# Patient Record
Sex: Female | Born: 1990 | Race: Black or African American | Hispanic: No | Marital: Single | Smoking: Never smoker
Health system: Southern US, Community
[De-identification: ages and names within clinical notes are randomized; demographics above are authoritative.]

## PROBLEM LIST (undated history)

## (undated) DIAGNOSIS — I10 Essential (primary) hypertension: Secondary | ICD-10-CM

## (undated) HISTORY — DX: Essential (primary) hypertension: I10

## (undated) HISTORY — PX: WISDOM TOOTH EXTRACTION: SHX21

---

## 2013-04-12 ENCOUNTER — Encounter (HOSPITAL_COMMUNITY): Payer: Self-pay | Admitting: Emergency Medicine

## 2013-04-12 ENCOUNTER — Emergency Department (HOSPITAL_COMMUNITY)
Admission: EM | Admit: 2013-04-12 | Discharge: 2013-04-13 | Disposition: A | Discharge status: Home / Self Care | Payer: 59 | Attending: Emergency Medicine | Admitting: Emergency Medicine

## 2013-04-12 DIAGNOSIS — W19XXXA Unspecified fall, initial encounter: Secondary | ICD-10-CM

## 2013-04-12 DIAGNOSIS — R000 Tachycardia, unspecified: Secondary | ICD-10-CM | POA: Insufficient documentation

## 2013-04-12 DIAGNOSIS — S7000XA Contusion of unspecified hip, initial encounter: Secondary | ICD-10-CM | POA: Insufficient documentation

## 2013-04-12 DIAGNOSIS — M545 Low back pain, unspecified: Secondary | ICD-10-CM | POA: Insufficient documentation

## 2013-04-12 DIAGNOSIS — W010XXA Fall on same level from slipping, tripping and stumbling without subsequent striking against object, initial encounter: Secondary | ICD-10-CM | POA: Insufficient documentation

## 2013-04-12 DIAGNOSIS — S7002XA Contusion of left hip, initial encounter: Secondary | ICD-10-CM

## 2013-04-12 DIAGNOSIS — Y929 Unspecified place or not applicable: Secondary | ICD-10-CM | POA: Insufficient documentation

## 2013-04-12 DIAGNOSIS — Y9301 Activity, walking, marching and hiking: Secondary | ICD-10-CM | POA: Insufficient documentation

## 2013-04-12 NOTE — ED Notes (Signed)
Pt st's she slipped in oil and fell backwards,  Pt c/o pain in left hip and left lower back.

## 2013-04-12 NOTE — ED Notes (Signed)
Pt. slipped and fell on slippery floor at [employer] this evening , no LOC / Ambulatory , reports pain at left hip and lower back .

## 2013-04-13 MED ORDER — IBUPROFEN 400 MG PO TABS
600.0000 mg | ORAL_TABLET | Freq: Once | ORAL | Status: AC
  Administered 2013-04-13: 600 mg via ORAL
  Filled 2013-04-13 (×2): qty 1

## 2013-04-13 MED ORDER — IBUPROFEN 600 MG PO TABS: 600.0000 mg | ORAL_TABLET | Freq: Once | ORAL | Status: DC

## 2013-04-13 NOTE — ED Provider Notes (Signed)
Medical screening examination/treatment/procedure(s) were performed by non-physician practitioner and as supervising physician I was immediately available for consultation/collaboration.   Halynn Reitano, MD 04/13/13 0650 

## 2013-04-13 NOTE — ED Provider Notes (Signed)
CSN: [id number]     Arrival date & time 04/12/13  2259 History   First MD Initiated Contact with Patient 04/12/13 2358     Chief Complaint  Patient presents with  . Fall   (Consider location/radiation/quality/duration/timing/severity/associated sxs/prior Treatment) HPI Comments: Patient states she slipped on a wet floor.  This evening landing on her left buttock/back presents with left hip pain.  She has not taken any medication.  Prior to arrival.  She is fully and the tori  Patient is a 22 y.o. female presenting with fall.  Fall This is a new problem. The current episode started today. The problem occurs constantly. The problem has been unchanged. Pertinent negatives include no joint swelling, numbness or weakness. Associated symptoms comments: Left hip pain. The symptoms are aggravated by walking. She has tried nothing for the symptoms. The treatment provided no relief.    History reviewed. No pertinent past medical history. History reviewed. No pertinent past surgical history. No family history on file. History  Substance Use Topics  . Smoking status: Never Smoker   . Smokeless tobacco: Not on file  . Alcohol Use: Yes   OB History   Grav Para Term Preterm Abortions TAB SAB Ect Mult Living                 Review of Systems  Musculoskeletal: Positive for back pain. Negative for joint swelling.  Skin: Negative for wound.  Neurological: Negative for weakness and numbness.  All other systems reviewed and are negative.    Allergies  Review of patient's allergies indicates no known allergies.  Home Medications   Current Outpatient Rx  Name  Route  Sig  Dispense  Refill  . ibuprofen (ADVIL,MOTRIN) 600 MG tablet   Oral   Take 1 tablet (600 mg total) by mouth once.   30 tablet   0    BP 133/86  Pulse 106  Temp(Src) 98.4 F (36.9 C) (Oral)  Resp 18  Wt 207 lb 14.4 oz (94.303 kg)  SpO2 98%  LMP 03/29/2013 Physical Exam  Nursing note and vitals  reviewed. Constitutional: She is oriented to person, place, and time. She appears well-developed and well-nourished.  Eyes: Pupils are equal, round, and reactive to light.  Neck: Normal range of motion.  Cardiovascular: Regular rhythm.  Tachycardia present.   Pulmonary/Chest: Effort normal.  Musculoskeletal: Normal range of motion. She exhibits no edema.       Left hip: She exhibits tenderness. She exhibits normal range of motion, normal strength, no bony tenderness, no swelling, no crepitus and no deformity.       Legs: Neurological: She is alert and oriented to person, place, and time.    ED Course  Procedures (including critical care time) Labs Review Labs Reviewed - No data to display Imaging Review No results found.  EKG Interpretation   None       MDM   1. Fall, initial encounter   2. Contusion, hip, left, initial encounter     At this time.  I do not feel x-rays are warranted.  Patient is fully tori.  Has minimal discomfort with ambulation.  She was prescribed ibuprofen    Arman Filter, NP 04/13/13 0025

## 2013-04-20 ENCOUNTER — Emergency Department (HOSPITAL_COMMUNITY): Payer: 59

## 2013-04-20 ENCOUNTER — Encounter (HOSPITAL_COMMUNITY): Payer: Self-pay | Admitting: Emergency Medicine

## 2013-04-20 ENCOUNTER — Emergency Department (HOSPITAL_COMMUNITY)
Admission: EM | Admit: 2013-04-20 | Discharge: 2013-04-20 | Disposition: A | Discharge status: Home / Self Care | Payer: 59 | Attending: Emergency Medicine | Admitting: Emergency Medicine

## 2013-04-20 DIAGNOSIS — R079 Chest pain, unspecified: Secondary | ICD-10-CM

## 2013-04-20 DIAGNOSIS — R12 Heartburn: Secondary | ICD-10-CM | POA: Insufficient documentation

## 2013-04-20 DIAGNOSIS — F411 Generalized anxiety disorder: Secondary | ICD-10-CM | POA: Insufficient documentation

## 2013-04-20 DIAGNOSIS — Z3202 Encounter for pregnancy test, result negative: Secondary | ICD-10-CM | POA: Insufficient documentation

## 2013-04-20 DIAGNOSIS — R072 Precordial pain: Secondary | ICD-10-CM | POA: Insufficient documentation

## 2013-04-20 LAB — URINE MICROSCOPIC-ADD ON

## 2013-04-20 LAB — URINALYSIS, ROUTINE W REFLEX MICROSCOPIC
Bilirubin Urine: NEGATIVE
Glucose, UA: NEGATIVE mg/dL
Ketones, ur: 15 mg/dL — AB
Leukocytes, UA: NEGATIVE
Protein, ur: 30 mg/dL — AB
Urobilinogen, UA: 1 mg/dL (ref 0.0–1.0)

## 2013-04-20 LAB — PREGNANCY, URINE: Preg Test, Ur: NEGATIVE

## 2013-04-20 MED ORDER — ALPRAZOLAM 0.5 MG PO TABS: 0.5000 mg | ORAL_TABLET | Freq: Every evening | ORAL | Status: DC | PRN

## 2013-04-20 MED ORDER — ALPRAZOLAM 0.25 MG PO TABS
1.0000 mg | ORAL_TABLET | Freq: Once | ORAL | Status: AC
  Administered 2013-04-20: 1 mg via ORAL
  Filled 2013-04-20: qty 4

## 2013-04-20 NOTE — ED Notes (Signed)
Patient transported to X-ray 

## 2013-04-20 NOTE — ED Provider Notes (Signed)
CSN: [id number]     Arrival date & time 04/20/13  8295 History   First MD Initiated Contact with Patient 04/20/13 1004     Chief Complaint  Patient presents with  . Chest Pain   (Consider location/radiation/quality/duration/timing/severity/associated sxs/prior Treatment) Patient is a 22 y.o. female presenting with chest pain. The history is provided by the patient. No language interpreter was used.  Chest Pain Pain location:  Substernal area Pain quality: dull   Pain radiates to:  Does not radiate Pain radiates to the back: no   Pain severity:  Mild Onset quality:  Gradual Timing:  Constant Progression:  Unchanged Chronicity:  New Context: breathing   Relieved by:  None tried Associated symptoms: anxiety   Associated symptoms: no abdominal pain, no back pain, no cough, no dizziness, no fatigue, no fever, no heartburn, no nausea, no near-syncope, no palpitations, no shortness of breath, not vomiting and no weakness   Risk factors: no high cholesterol, no hypertension, no prior DVT/PE and no smoking   pt is a well-appearing, 22 year old female who presents with a history of chest pain since last night at about 8pm. She reports that she has had an aching pain in her chest that hurts when she takes a deep breath. She reports that this pain is substernal and is reproducible. She denies shortness of breath, palpitations, fever, cough or chills. She doesn't have a history of asthma, PE or DVT however, she does have a history significant for anxiety which she is not currently taking any medication. She has been using the nuva ring for birth control for the last 5 months, before that she was taking birth control pills. She denies recent sick exposure or travel. She reports that she sometimes has heartburn and indigestion but she doesn't regularly take medicine for it.    History reviewed. No pertinent past medical history. History reviewed. No pertinent past surgical history. History reviewed.  No pertinent family history. History  Substance Use Topics  . Smoking status: Never Smoker   . Smokeless tobacco: Not on file  . Alcohol Use: Yes   OB History   Grav Para Term Preterm Abortions TAB SAB Ect Mult Living                 Review of Systems  Constitutional: Negative for fever and fatigue.  Respiratory: Negative for cough and shortness of breath.   Cardiovascular: Positive for chest pain. Negative for palpitations and near-syncope.  Gastrointestinal: Negative for heartburn, nausea, vomiting and abdominal pain.  Musculoskeletal: Negative for back pain.  Neurological: Negative for dizziness and weakness.    Allergies  Review of patient's allergies indicates no known allergies.  Home Medications   Current Outpatient Rx  Name  Route  Sig  Dispense  Refill  . ibuprofen (ADVIL,MOTRIN) 600 MG tablet   Oral   Take 1 tablet (600 mg total) by mouth once.   30 tablet   0    BP 112/75  Pulse 89  Temp(Src) 98.6 F (37 C) (Oral)  Resp 20  Ht 5\' 7"  (1.702 m)  Wt 200 lb (90.719 kg)  BMI 31.32 kg/m2  SpO2 98%  LMP 04/06/2013 Physical Exam  Nursing note and vitals reviewed. Constitutional: She is oriented to person, place, and time. She appears well-developed and well-nourished. No distress.  HENT:  Head: Normocephalic and atraumatic.  Mouth/Throat: Oropharynx is clear and moist.  Eyes: Conjunctivae and EOM are normal. Pupils are equal, round, and reactive to light.  Neck: Normal  range of motion. Neck supple. No tracheal deviation present. No thyromegaly present.  Cardiovascular: Normal rate, regular rhythm, normal heart sounds and intact distal pulses.   Pulmonary/Chest: Effort normal and breath sounds normal. No respiratory distress. She has no wheezes.  Abdominal: Soft. Bowel sounds are normal. There is no tenderness.  Musculoskeletal: Normal range of motion.  Lymphadenopathy:    She has no cervical adenopathy.  Neurological: She is alert and oriented to person,  place, and time.  Skin: Skin is warm and dry.  Psychiatric: She has a normal mood and affect. Her behavior is normal. Judgment and thought content normal.    ED Course  Procedures (including critical care time) Labs Review Labs Reviewed - No data to display Imaging Review No results found.  EKG Interpretation     Ventricular Rate:  84 PR Interval:  130 QRS Duration: 86 QT Interval:  368 QTC Calculation: 434 R Axis:   22 Text Interpretation:  Normal sinus rhythm Minimal voltage criteria for LVH, may be normal variant Non-specific t wave changes            MDM   1. Chest pain with low risk for cardiac etiology    Well-appearing 22 year old female. No significant risk factors. Troponin negative. EKG, chest x-ray within normal limits. No recent illness, shortness of breath, difficulty breathing, cough or fever. No history of PE or DVT, non-smoker. Wells criteria=0, Perc=0. Minimal risk for cardiac etiology or PE. Does admit to having a history of anxiety and feeling better after one time dose of alprazolam.       Irish Elders, NP 04/20/13 1216

## 2013-04-20 NOTE — ED Notes (Signed)
Pt reports having mid chest pains that started last night, radiates around to right side, having sob. ekg done at triage, no acute distress noted.

## 2013-04-21 NOTE — ED Provider Notes (Signed)
Medical screening examination/treatment/procedure(s) were performed by non-physician practitioner and as supervising physician I was immediately available for consultation/collaboration.  EKG Interpretation     Ventricular Rate:  84 PR Interval:  130 QRS Duration: 86 QT Interval:  368 QTC Calculation: 434 R Axis:   22 Text Interpretation:  Normal sinus rhythm Minimal voltage criteria for LVH, may be normal variant Non-specific t wave changes              Junius Argyle, MD 04/21/13 [phone]

## 2013-07-23 ENCOUNTER — Emergency Department (HOSPITAL_COMMUNITY)
Admission: EM | Admit: 2013-07-23 | Discharge: 2013-07-23 | Disposition: A | Discharge status: Home / Self Care | Payer: Federal, State, Local not specified - PPO | Source: Home / Self Care | Attending: Family Medicine | Admitting: Family Medicine

## 2013-07-23 ENCOUNTER — Encounter (HOSPITAL_COMMUNITY): Payer: Self-pay | Admitting: Emergency Medicine

## 2013-07-23 DIAGNOSIS — S61209A Unspecified open wound of unspecified finger without damage to nail, initial encounter: Secondary | ICD-10-CM

## 2013-07-23 DIAGNOSIS — S61219A Laceration without foreign body of unspecified finger without damage to nail, initial encounter: Secondary | ICD-10-CM

## 2013-07-23 NOTE — ED Provider Notes (Signed)
CSN: [id number]     Arrival date & time 07/23/13  1720 History   None    Chief Complaint  Patient presents with  . Laceration   (Consider location/radiation/quality/duration/timing/severity/associated sxs/prior Treatment) HPI 23 year old F with a laceration of the right little finger and ulnar side. Occurred several hours ago when getting our of her bed. She struck it on the metal bed frame. It bled for several minutes until she applied pressure. She was in pain so she took aspirin. No problems with use of the finger. Last tetanus prior to college years ago.   History reviewed. No pertinent past medical history. History reviewed. No pertinent past surgical history. No family history on file. History  Substance Use Topics  . Smoking status: Never Smoker   . Smokeless tobacco: Not on file  . Alcohol Use: Yes   OB History   Grav Para Term Preterm Abortions TAB SAB Ect Mult Living                 Review of Systems  Allergies  Review of patient's allergies indicates no known allergies.  Home Medications   Current Outpatient Rx  Name  Route  Sig  Dispense  Refill  . ALPRAZolam (XANAX) 0.5 MG tablet   Oral   Take 1 tablet (0.5 mg total) by mouth at bedtime as needed for sleep.   15 tablet   0   . ibuprofen (ADVIL,MOTRIN) 600 MG tablet   Oral   Take 1 tablet (600 mg total) by mouth once.   30 tablet   0    BP 120/81  Pulse 88  Temp(Src) 98.7 F (37.1 C) (Oral)  Resp 16  SpO2 100% Physical Exam Gen: well appearing, pleasant and conversant Skin: 1.5 cm well approximate superficial laceration with hemostasis achieved on the lateral finger  MSK: right 5th digit with normal flexion and extension, sensation intact   ED Course  Procedures (including critical care time) Labs Review Labs Reviewed - No data to display Imaging Review No results found.    MDM   1. Laceration of finger, right    Simple, clean, hemostasis achieved, given location steri-strips placed  to re-inforce approximation    Garnetta BuddyEdward V Jakeim Sedore, MD 07/23/13 [phone]

## 2013-07-23 NOTE — ED Notes (Signed)
States aprox 3 pm today, she accidentally struck her right 5 th finger on hard object, causing laceration w temp. Bleeding controlled. Injury aprox 2-3 cm , superficial

## 2013-07-23 NOTE — Discharge Instructions (Signed)
Keep the finger dry for the next 24 hours. After that you can wash it. The steri-strips will come off on their own in a couple days. You may also keep a bandage on there to keep if from getting bumped. Take tylenol as needed for pain. Come back if the bleeding starts again.   Sincerely,   Dr. Clinton SawyerWilliamson

## 2013-07-25 NOTE — ED Provider Notes (Signed)
Medical screening examination/treatment/procedure(s) were performed by resident physician or non-physician practitioner and as supervising physician I was immediately available for consultation/collaboration.   Ryane Konieczny DOUGLAS MD.   Ysabel Stankovich D Kenden Brandt, MD 07/25/13 1229 

## 2013-11-04 ENCOUNTER — Encounter (HOSPITAL_COMMUNITY): Payer: Self-pay | Admitting: Emergency Medicine

## 2013-11-04 ENCOUNTER — Emergency Department (HOSPITAL_COMMUNITY)
Admission: EM | Admit: 2013-11-04 | Discharge: 2013-11-04 | Disposition: A | Discharge status: Home / Self Care | Payer: Federal, State, Local not specified - PPO | Attending: Emergency Medicine | Admitting: Emergency Medicine

## 2013-11-04 DIAGNOSIS — Z792 Long term (current) use of antibiotics: Secondary | ICD-10-CM | POA: Insufficient documentation

## 2013-11-04 DIAGNOSIS — K089 Disorder of teeth and supporting structures, unspecified: Secondary | ICD-10-CM | POA: Insufficient documentation

## 2013-11-04 DIAGNOSIS — K029 Dental caries, unspecified: Secondary | ICD-10-CM | POA: Insufficient documentation

## 2013-11-04 DIAGNOSIS — K0889 Other specified disorders of teeth and supporting structures: Secondary | ICD-10-CM

## 2013-11-04 DIAGNOSIS — Z791 Long term (current) use of non-steroidal anti-inflammatories (NSAID): Secondary | ICD-10-CM | POA: Insufficient documentation

## 2013-11-04 MED ORDER — NAPROXEN 250 MG PO TABS
500.0000 mg | ORAL_TABLET | Freq: Once | ORAL | Status: AC
  Administered 2013-11-04: 500 mg via ORAL
  Filled 2013-11-04: qty 2

## 2013-11-04 MED ORDER — PENICILLIN V POTASSIUM 500 MG PO TABS: 500.0000 mg | ORAL_TABLET | Freq: Three times a day (TID) | ORAL | Status: DC

## 2013-11-04 MED ORDER — NAPROXEN 500 MG PO TABS: 500.0000 mg | ORAL_TABLET | Freq: Two times a day (BID) | ORAL | Status: DC

## 2013-11-04 MED ORDER — PENICILLIN V POTASSIUM 250 MG PO TABS
500.0000 mg | ORAL_TABLET | Freq: Once | ORAL | Status: AC
  Administered 2013-11-04: 500 mg via ORAL
  Filled 2013-11-04: qty 2

## 2013-11-04 MED ORDER — OXYCODONE-ACETAMINOPHEN 5-325 MG PO TABS: 2.0000 | ORAL_TABLET | ORAL | Status: DC | PRN

## 2013-11-04 NOTE — ED Notes (Signed)
Pt given Ice pack for pain.

## 2013-11-04 NOTE — Discharge Instructions (Signed)
Dental Pain A tooth ache may be caused by cavities (tooth decay). Cavities expose the nerve of the tooth to air and hot or cold temperatures. It may come from an infection or abscess (also called a boil or furuncle) around your tooth. It is also often caused by dental caries (tooth decay). This causes the pain you are having. DIAGNOSIS  Your caregiver can diagnose this problem by exam. TREATMENT   If caused by an infection, it may be treated with medications which kill germs (antibiotics) and pain medications as prescribed by your caregiver. Take medications as directed.  Only take over-the-counter or prescription medicines for pain, discomfort, or fever as directed by your caregiver.  Whether the tooth ache today is caused by infection or dental disease, you should see your dentist as soon as possible for further care. SEEK MEDICAL CARE IF: The exam and treatment you received today has been provided on an emergency basis only. This is not a substitute for complete medical or dental care. If your problem worsens or new problems (symptoms) appear, and you are unable to meet with your dentist, call or return to this location. SEEK IMMEDIATE MEDICAL CARE IF:   You have a fever.  You develop redness and swelling of your face, jaw, or neck.  You are unable to open your mouth.  You have severe pain uncontrolled by pain medicine. MAKE SURE YOU:   Understand these instructions.  Will watch your condition.  Will get help right away if you are not doing well or get worse. Document Released: 06/10/2005 Document Revised: 09/02/2011 Document Reviewed: 01/27/2008 [facility] Patient Information 2014 [employer] [place].  If you were a [facility] patient or do not have insurance, here are some clinics in our community that may be able to provide care for free or on sliding payment scales.  [facility], [address], [address], [place], [phone]; Monday to  Friday, 9 a.m. - 7 p.m.; Saturday 9 a.m. to 1 p.m.   [facility], [address] [place]; [phone]; or [address],  [place]; [phone].    [facility], [place] [place]; [phone]; Monday to Wednesday, 8:30 a.m. - 5 p.m.; Thursday, 8:30 a.m. - 8 p.m.   [facility], [address], [address], [place]; [phone]; Monday to Friday, 8 a.m. - 4:30 p.m.    [facility], [address] [place], [phone]; first and third Saturday of the  month, 9:30 a.m. - 12:30 p.m.   [facility], [address] [place], [phone]; second Saturday of the month, 9 a.m. -noon.   RESOURCE GUIDE  Dental Problems  Patients with Medicaid: [facility]                                            [facility] [phone]                                                                   [phone] Phone:  [phone]  Phone:  [phone]  If unable to pay or uninsured, contact:  [facility] or [facility] to become qualified for the adult dental clinic.  Chronic Pain Problems Contact [facility]  [phone] Patients need to be referred by their primary care doctor.  Insufficient Money for Medicine Contact United Way:  call "211" or [facility] [phone].  No Primary Care Doctor Call [facility]  [phone] Other agencies that provide inexpensive medical care    [facility]  [phone]    [facility]  [phone]    [facility]  [phone]    [facility]  [phone]    [facility]  [phone]  Psychological Services [facility]  [phone] [facility]  [phone] [facility]   [phone] (emergency services [phone])  Abuse/Neglect [facility] Child [facility] Hotline 401-607-3546(336)  210-332-7599 [facility] Hotline 934 522 [phone] (After Hours)  [facility] [phone]) [phone]  [facility] [place] [facility] [phone] [facility] [phone]  MRSA Hotline #:   (639)110-37832246043357  [facility] Resources  [facility]     United Way                          [facility] [address] [place]                        [address]          [address]  [place]                                                [place]                            [place] Phone:  [phone]                                     Phone:  [phone]                   Phone:  [phone]  [facility] Phone:  [phone]  [facility] Child Abuse Hotline 779-778-4492(336) (361)316-5400 [phone](336) [phone] (After Hours)   Community Resources: *IF YOU ARE IN IMMEDIATE DANGER CALL 911!  Abuse/Neglect:  Family Services Crisis Hotline [facility]): [phone](336) 7737520597 [facility]): [phone](336) [phone]  After hours, holidays and weekends: [phone](336) [phone] National Domestic Violence Hotline: 206 675 [phone]  Mental Health: [facility]: [address]: [phone]  Health Clinics:  [facility]): [phone] Monday - Friday 8 AM - 9 PM, Saturday and Sunday 10 AM - 9 PM   [facility]  [address]: [phone] Monday- Friday 8:30 AM - 5:30 PM, Sat 9 AM - 1 PM  [facility] [address]: [phone] [facility]: [phone]) [phone] [facility] [address]: [phone]  [facility]: [address] [phone]

## 2013-11-04 NOTE — ED Provider Notes (Signed)
CSN: [id number]     Arrival date & time 11/04/13  0131 History   First MD Initiated Contact with Patient 11/04/13 0356     Chief Complaint  Patient presents with  . Dental Pain     (Consider location/radiation/quality/duration/timing/severity/associated sxs/prior Treatment) HPI 23 year old female presents to emergency department with complaint of dental pain.  Patient reports she has had problems with a broken molar and a wisdom tooth that is coming in on the left upper and lower jaws.  She has not contacted a dentist.  Over the last several days, the pain has increased.  She denies any soft tissue swelling.  She's been taking Vicodin and ibuprofen without improvement. History reviewed. No pertinent past medical history. History reviewed. No pertinent past surgical history. No family history on file. History  Substance Use Topics  . Smoking status: Never Smoker   . Smokeless tobacco: Not on file  . Alcohol Use: Yes   OB History   Grav Para Term Preterm Abortions TAB SAB Ect Mult Living                 Review of Systems   See History of Present Illness; otherwise all other systems are reviewed and negative  Allergies  Review of patient's allergies indicates no known allergies.  Home Medications   Prior to Admission medications   Medication Sig Start Date End Date Taking? Authorizing Provider  ALPRAZolam Prudy Feeler(XANAX) 0.5 MG tablet Take 1 tablet (0.5 mg total) by mouth at bedtime as needed for sleep. 04/20/13   Irish EldersKelly Walker, NP  ibuprofen (ADVIL,MOTRIN) 600 MG tablet Take 1 tablet (600 mg total) by mouth once. 04/13/13   Arman FilterGail K Schulz, NP  naproxen (NAPROSYN) 500 MG tablet Take 1 tablet (500 mg total) by mouth 2 (two) times daily. 11/04/13   Olivia Mackielga M Birch Farino, MD  oxyCODONE-acetaminophen (PERCOCET/ROXICET) 5-325 MG per tablet Take 2 tablets by mouth every 4 (four) hours as needed for severe pain. 11/04/13   Olivia Mackielga M Brandee Markin, MD  penicillin v potassium (VEETID) 500 MG tablet Take 1 tablet (500  mg total) by mouth 3 (three) times daily. 11/04/13   Olivia Mackielga M Kadence Mikkelson, MD   BP 118/79  Pulse 94  Temp(Src) 98.2 F (36.8 C) (Oral)  Resp 18  Ht 5\' 7"  (1.702 m)  Wt 201 lb (91.173 kg)  BMI 31.47 kg/m2  SpO2 100% Physical Exam  Nursing note and vitals reviewed. Constitutional: She is oriented to person, place, and time. She appears distressed (uncomfortable appearing).  HENT:  Head: Normocephalic and atraumatic.  Right Ear: External ear normal.  Nose: Nose normal.  Mouth/Throat: Oropharynx is clear and moist. No oropharyngeal exudate.  Patient has dental decay to left most posterior molar on the maxilla  Patient has a wisdom tooth erupting on left mandible.  There is no soft tissue swelling, no signs of acute abscess  Eyes: Conjunctivae and EOM are normal. Pupils are equal, round, and reactive to light. Right eye exhibits no discharge. Left eye exhibits no discharge. No scleral icterus.  Neck: Normal range of motion. Neck supple. No JVD present. No tracheal deviation present. No thyromegaly present.  Cardiovascular: Normal rate, regular rhythm, normal heart sounds and intact distal pulses.  Exam reveals no gallop and no friction rub.   No murmur heard. Pulmonary/Chest: Effort normal and breath sounds normal. No stridor. No respiratory distress. She has no wheezes. She has no rales. She exhibits no tenderness.  Musculoskeletal: Normal range of motion. She exhibits no edema and no tenderness.  Lymphadenopathy:    She has no cervical adenopathy.  Neurological: She is alert and oriented to person, place, and time.  Skin: Skin is warm and dry. No rash noted. No erythema. No pallor.  Psychiatric: She has a normal mood and affect. Her behavior is normal. Judgment and thought content normal.    ED Course  Procedures (including critical care time) Labs Review Labs Reviewed - No data to display  Imaging Review No results found.   EKG Interpretation None      MDM   Final diagnoses:   Pain, dental    23 year old female with dental pain.  Patient advised to follow up with a local dentist.  We'll start her on Percocet and penicillin.    Olivia Mackielga M Yuma Blucher, MD 11/04/13 [phone]

## 2013-11-04 NOTE — ED Notes (Signed)
MD at bedside. 

## 2013-11-04 NOTE — ED Notes (Signed)
Pt states pain in her upper and load jaw on her left side from her wisdom teeth.  Pt states pain started approximately one year prior

## 2014-07-04 ENCOUNTER — Emergency Department (HOSPITAL_COMMUNITY)
Admission: EM | Admit: 2014-07-04 | Discharge: 2014-07-05 | Disposition: A | Discharge status: Home / Self Care | Payer: Federal, State, Local not specified - PPO | Attending: Emergency Medicine | Admitting: Emergency Medicine

## 2014-07-04 ENCOUNTER — Encounter (HOSPITAL_COMMUNITY): Payer: Self-pay | Admitting: Emergency Medicine

## 2014-07-04 DIAGNOSIS — Z793 Long term (current) use of hormonal contraceptives: Secondary | ICD-10-CM | POA: Diagnosis not present

## 2014-07-04 DIAGNOSIS — G43809 Other migraine, not intractable, without status migrainosus: Secondary | ICD-10-CM | POA: Insufficient documentation

## 2014-07-04 DIAGNOSIS — R51 Headache: Secondary | ICD-10-CM | POA: Diagnosis present

## 2014-07-04 NOTE — ED Provider Notes (Signed)
CSN: [id number]     Arrival date & time 07/04/14  2337 History  This chart was scribed for Loren Racer, MD by Annye Asa, ED Scribe. This patient was seen in room D31C/D31C and the patient's care was started at 12:13 AM.    Chief Complaint  Patient presents with  . Migraine   The history is provided by the patient. No language interpreter was used.     HPI Comments: Mikayla Castillo is a 24 y.o. female who presents to the Emergency Department complaining of right-sided migraine headache, described as pressure, beginning today around 12:00. Gradual onset. Similar in character to previous migraines. She reports photophobia, blurred vision, and nausea. She denies vomiting. She attempted to treat her pain with Tylenol at 12:30; no relief provided. She believes she had a sinus infection last week.   LNMP three weeks PTA. She does not associated her headaches with her menstrual cycle or life stress.   History reviewed. No pertinent past medical history. History reviewed. No pertinent past surgical history. No family history on file. History  Substance Use Topics  . Smoking status: Never Smoker   . Smokeless tobacco: Not on file  . Alcohol Use: Yes   OB History    No data available     Review of Systems  Constitutional: Negative for fever and chills.  HENT: Negative for congestion, rhinorrhea and sinus pressure.   Eyes: Positive for photophobia and visual disturbance.  Gastrointestinal: Positive for nausea. Negative for vomiting, abdominal pain and diarrhea.  Neurological: Positive for headaches. Negative for dizziness, weakness, light-headedness and numbness.  All other systems reviewed and are negative.   Allergies  Review of patient's allergies indicates no known allergies.  Home Medications   Prior to Admission medications   Medication Sig Start Date End Date Taking? Authorizing Provider  Norethin Ace-Eth Estrad-FE (MINASTRIN 24 FE PO) Take 1 tablet by mouth daily.    Yes Historical Provider, MD  oxyCODONE-acetaminophen (PERCOCET/ROXICET) 5-325 MG per tablet Take 2 tablets by mouth every 4 (four) hours as needed for severe pain. 11/04/13  Yes Olivia Mackie, MD  ALPRAZolam Prudy Feeler) 0.5 MG tablet Take 1 tablet (0.5 mg total) by mouth at bedtime as needed for sleep. Patient not taking: Reported on 07/05/2014 04/20/13   Irish Elders, NP  ibuprofen (ADVIL,MOTRIN) 600 MG tablet Take 1 tablet (600 mg total) by mouth every 6 (six) hours as needed for headache. 07/05/14   Loren Racer, MD  metoCLOPramide (REGLAN) 10 MG tablet Take 1 tablet (10 mg total) by mouth every 6 (six) hours as needed for nausea (nausea/headache). 07/05/14   Loren Racer, MD  naproxen (NAPROSYN) 500 MG tablet Take 1 tablet (500 mg total) by mouth 2 (two) times daily. Patient not taking: Reported on 07/05/2014 11/04/13   Olivia Mackie, MD  penicillin v potassium (VEETID) 500 MG tablet Take 1 tablet (500 mg total) by mouth 3 (three) times daily. Patient not taking: Reported on 07/05/2014 11/04/13   Olivia Mackie, MD   BP 114/58 mmHg  Pulse 83  Temp(Src) 98.6 F (37 C) (Oral)  Resp 22  Ht  (1.702 m)  Wt 210 lb (95.255 kg)  BMI 32.88 kg/m2  SpO2 100%  LMP 06/20/2014 Physical Exam  Constitutional: She is oriented to person, place, and time. She appears well-developed and well-nourished. No distress.  HENT:  Head: Normocephalic and atraumatic.  Mouth/Throat: Oropharynx is clear and moist.  No sinus tenderness with percussion.  Eyes: EOM are normal. Pupils are equal,  round, and reactive to light.  Neck: Normal range of motion. Neck supple.  No meningismus  Cardiovascular: Normal rate and regular rhythm.  Exam reveals no gallop and no friction rub.   No murmur heard. Pulmonary/Chest: Effort normal and breath sounds normal. No respiratory distress. She has no wheezes. She has no rales. She exhibits no tenderness.  Abdominal: Soft. Bowel sounds are normal. She exhibits no distension and no  mass. There is no tenderness. There is no rebound and no guarding.  Musculoskeletal: Normal range of motion. She exhibits no edema or tenderness.  Neurological: She is alert and oriented to person, place, and time.  Patient is alert and oriented x3 with clear, goal oriented speech. Patient has 5/5 motor in all extremities. Sensation is intact to light touch. Bilateral finger-to-nose is normal with no signs of dysmetria. Patient has a normal gait and walks without assistance.  Skin: Skin is warm and dry. No rash noted. No erythema.  Psychiatric: She has a normal mood and affect. Her behavior is normal.  Nursing note and vitals reviewed.   ED Course  Procedures   .DIAGNOSTIC STUDIES: Oxygen Saturation is 97% on RA, adequate by my interpretation.    COORDINATION OF CARE: 2:28 AM Discussed treatment plan with pt at bedside and pt agreed to plan.   Labs Review Labs Reviewed - No data to display  Imaging Review No results found.   EKG Interpretation None      MDM   Final diagnoses:  Other migraine without status migrainosus, not intractable    I personally performed the services described in this documentation, which was scribed in my presence. The recorded information has been reviewed and considered.   Headache is resolved. Return precautions given.  Loren Raceravid Anzel Kearse, MD 07/05/14 [phone]

## 2014-07-04 NOTE — ED Notes (Signed)
Pt. reports migraine headache onset today unrelieved by OTC pain medication , mild nausea/ no emesis , denies fever or chills.

## 2014-07-05 MED ORDER — METOCLOPRAMIDE HCL 5 MG/ML IJ SOLN
10.0000 mg | Freq: Once | INTRAMUSCULAR | Status: AC
  Administered 2014-07-05: 10 mg via INTRAVENOUS
  Filled 2014-07-05: qty 2

## 2014-07-05 MED ORDER — METHYLPREDNISOLONE SODIUM SUCC 125 MG IJ SOLR
125.0000 mg | Freq: Once | INTRAMUSCULAR | Status: AC
  Administered 2014-07-05: 125 mg via INTRAVENOUS
  Filled 2014-07-05: qty 2

## 2014-07-05 MED ORDER — DIPHENHYDRAMINE HCL 50 MG/ML IJ SOLN
25.0000 mg | Freq: Once | INTRAMUSCULAR | Status: AC
  Administered 2014-07-05: 25 mg via INTRAVENOUS
  Filled 2014-07-05: qty 1

## 2014-07-05 MED ORDER — KETOROLAC TROMETHAMINE 30 MG/ML IJ SOLN
30.0000 mg | Freq: Once | INTRAMUSCULAR | Status: AC
  Administered 2014-07-05: 30 mg via INTRAVENOUS
  Filled 2014-07-05: qty 1

## 2014-07-05 MED ORDER — IBUPROFEN 600 MG PO TABS: 600.0000 mg | ORAL_TABLET | Freq: Four times a day (QID) | ORAL | Status: DC | PRN

## 2014-07-05 MED ORDER — SODIUM CHLORIDE 0.9 % IV BOLUS (SEPSIS)
1000.0000 mL | Freq: Once | INTRAVENOUS | Status: AC
  Administered 2014-07-05: 1000 mL via INTRAVENOUS

## 2014-07-05 MED ORDER — METOCLOPRAMIDE HCL 10 MG PO TABS: 10.0000 mg | ORAL_TABLET | Freq: Four times a day (QID) | ORAL | Status: DC | PRN

## 2014-07-05 NOTE — Discharge Instructions (Signed)

## 2014-07-05 NOTE — ED Notes (Signed)
Pt reports headache to rt side of head that started yesterday afternoon at 12pm. Pt took tylenol around 12:30pm with no relief. Pt describes pain as pressure, and states she has blurred vision in the rt eye. Pt states she has a hx of headaches but this episode is worse than the usual headache. Dr. Ranae Palmsyelverton at bedside.

## 2014-07-08 ENCOUNTER — Emergency Department (HOSPITAL_COMMUNITY): Payer: Federal, State, Local not specified - PPO

## 2014-07-08 ENCOUNTER — Encounter (HOSPITAL_COMMUNITY): Payer: Self-pay | Admitting: Physical Medicine and Rehabilitation

## 2014-07-08 ENCOUNTER — Emergency Department (HOSPITAL_COMMUNITY)
Admission: EM | Admit: 2014-07-08 | Discharge: 2014-07-08 | Disposition: A | Discharge status: Home / Self Care | Payer: Federal, State, Local not specified - PPO | Attending: Emergency Medicine | Admitting: Emergency Medicine

## 2014-07-08 DIAGNOSIS — R51 Headache: Secondary | ICD-10-CM | POA: Diagnosis present

## 2014-07-08 DIAGNOSIS — G43111 Migraine with aura, intractable, with status migrainosus: Secondary | ICD-10-CM

## 2014-07-08 DIAGNOSIS — Z79899 Other long term (current) drug therapy: Secondary | ICD-10-CM | POA: Insufficient documentation

## 2014-07-08 DIAGNOSIS — R519 Headache, unspecified: Secondary | ICD-10-CM

## 2014-07-08 MED ORDER — DEXAMETHASONE SODIUM PHOSPHATE 10 MG/ML IJ SOLN
10.0000 mg | Freq: Once | INTRAMUSCULAR | Status: AC
  Administered 2014-07-08: 10 mg via INTRAVENOUS
  Filled 2014-07-08: qty 1

## 2014-07-08 MED ORDER — KETOROLAC TROMETHAMINE 30 MG/ML IJ SOLN
15.0000 mg | Freq: Once | INTRAMUSCULAR | Status: AC
  Administered 2014-07-08: 15 mg via INTRAVENOUS
  Filled 2014-07-08: qty 1

## 2014-07-08 MED ORDER — DIPHENHYDRAMINE HCL 50 MG/ML IJ SOLN
25.0000 mg | Freq: Once | INTRAMUSCULAR | Status: AC
  Administered 2014-07-08: 25 mg via INTRAVENOUS
  Filled 2014-07-08: qty 1

## 2014-07-08 MED ORDER — METOCLOPRAMIDE HCL 5 MG/ML IJ SOLN
10.0000 mg | Freq: Once | INTRAMUSCULAR | Status: AC
  Administered 2014-07-08: 10 mg via INTRAVENOUS
  Filled 2014-07-08: qty 2

## 2014-07-08 NOTE — ED Notes (Signed)
Pt states throbbing headache and pressure behind both eyes since Monday, no relief with medications at home. 10/10 pain upon arrival. Pt is alert and oriented x4.

## 2014-07-08 NOTE — ED Provider Notes (Addendum)
CSN: [id number]     Arrival date & time 07/08/14  1415 History   First MD Initiated Contact with Patient 07/08/14 1608     Chief Complaint  Patient presents with  . Headache     (Consider location/radiation/quality/duration/timing/severity/associated sxs/prior Treatment) Patient is a 24 y.o. female presenting with headaches. The history is provided by the patient.  Headache Pain location:  R temporal Quality:  Dull Radiates to:  Does not radiate Severity currently:  8/10 Severity at highest:  10/10 Onset quality:  Gradual Duration:  3 days Timing:  Intermittent Progression:  Waxing and waning Chronicity:  Recurrent Similar to prior headaches: yes   Context: activity, bright light and loud noise   Relieved by:  Nothing Worsened by:  Light Ineffective treatments:  Acetaminophen Associated symptoms: blurred vision, nausea and vomiting   Associated symptoms: no ear pain, no fever, no focal weakness, no hearing loss, no loss of balance, no neck stiffness, no numbness, no paresthesias, no photophobia, no seizures, no sinus pressure, no syncope and no tingling   Associated symptoms comment:  Some ringing in the right ear and intermittent right-sided blurry vision but nothing that is continuous   History reviewed. No pertinent past medical history. History reviewed. No pertinent past surgical history. No family history on file. History  Substance Use Topics  . Smoking status: Never Smoker   . Smokeless tobacco: Not on file  . Alcohol Use: Yes   OB History    No data available     Review of Systems  Constitutional: Negative for fever.  HENT: Negative for ear pain, hearing loss and sinus pressure.   Eyes: Positive for blurred vision. Negative for photophobia.  Cardiovascular: Negative for syncope.  Gastrointestinal: Positive for nausea and vomiting.  Musculoskeletal: Negative for neck stiffness.  Neurological: Positive for headaches. Negative for focal weakness, seizures,  numbness, paresthesias and loss of balance.  All other systems reviewed and are negative.     Allergies  Review of patient's allergies indicates no known allergies.  Home Medications   Prior to Admission medications   Medication Sig Start Date End Date Taking? Authorizing Provider  ALPRAZolam Prudy Feeler(XANAX) 0.5 MG tablet Take 1 tablet (0.5 mg total) by mouth at bedtime as needed for sleep. Patient not taking: Reported on 07/05/2014 04/20/13   Irish EldersKelly Walker, NP  ibuprofen (ADVIL,MOTRIN) 600 MG tablet Take 1 tablet (600 mg total) by mouth every 6 (six) hours as needed for headache. 07/05/14   Loren Raceravid Yelverton, MD  metoCLOPramide (REGLAN) 10 MG tablet Take 1 tablet (10 mg total) by mouth every 6 (six) hours as needed for nausea (nausea/headache). 07/05/14   Loren Raceravid Yelverton, MD  naproxen (NAPROSYN) 500 MG tablet Take 1 tablet (500 mg total) by mouth 2 (two) times daily. Patient not taking: Reported on 07/05/2014 11/04/13   Olivia Mackielga M Otter, MD  Norethin Ace-Eth Estrad-FE (MINASTRIN 24 FE PO) Take 1 tablet by mouth daily.    Historical Provider, MD  oxyCODONE-acetaminophen (PERCOCET/ROXICET) 5-325 MG per tablet Take 2 tablets by mouth every 4 (four) hours as needed for severe pain. 11/04/13   Olivia Mackielga M Otter, MD  penicillin v potassium (VEETID) 500 MG tablet Take 1 tablet (500 mg total) by mouth 3 (three) times daily. Patient not taking: Reported on 07/05/2014 11/04/13   Olivia Mackielga M Otter, MD   BP 130/66 mmHg  Pulse 88  Temp(Src) 98.3 F (36.8 C) (Oral)  Resp 16  Ht 5\' 7"  (1.702 m)  Wt 210 lb (95.255 kg)  BMI 32.88 kg/m2  SpO2 100%  LMP 06/20/2014 Physical Exam  Constitutional: She is oriented to person, place, and time. She appears well-developed and well-nourished. No distress.  HENT:  Head: Normocephalic and atraumatic.  Right Ear: Tympanic membrane and ear canal normal.  Left Ear: Tympanic membrane and ear canal normal.  Eyes: EOM are normal. Pupils are equal, round, and reactive to light.  Fundoscopic  exam:      The right eye shows no papilledema.       The left eye shows no papilledema.  Neck: Normal range of motion. Neck supple.  Cardiovascular: Normal rate, regular rhythm, normal heart sounds and intact distal pulses.  Exam reveals no friction rub.   No murmur heard. Pulmonary/Chest: Effort normal and breath sounds normal. She has no wheezes. She has no rales.  Abdominal: Soft. Bowel sounds are normal. She exhibits no distension. There is no tenderness. There is no rebound and no guarding.  Musculoskeletal: Normal range of motion. She exhibits no tenderness.  No edema  Lymphadenopathy:    She has no cervical adenopathy.  Neurological: She is alert and oriented to person, place, and time. She has normal strength. No cranial nerve deficit or sensory deficit. Gait normal.  photophobia  Skin: Skin is warm and dry. No rash noted.  Psychiatric: She has a normal mood and affect. Her behavior is normal.  Nursing note and vitals reviewed.   ED Course  Procedures (including critical care time) Labs Review Labs Reviewed - No data to display  Imaging Review Ct Head Wo Contrast  07/08/2014   CLINICAL DATA:  Initial encounter for right temporal region headache. Migraines for 6 years.  EXAM: CT HEAD WITHOUT CONTRAST  TECHNIQUE: Contiguous axial images were obtained from the base of the skull through the vertex without intravenous contrast.  COMPARISON:  None.  FINDINGS: Sinuses/Soft tissues: Clear paranasal sinuses and mastoid air cells.  Intracranial: No mass lesion, hemorrhage, hydrocephalus, acute infarct, intra-axial, or extra-axial fluid collection.  IMPRESSION: Normal head CT.   Electronically Signed   By: Jeronimo Greaves M.D.   On: 07/08/2014 17:27     EKG Interpretation None      MDM   Final diagnoses:  Headache  Intractable migraine with aura with status migrainosus    Pt with typical migraine HA without sx suggestive of SAH(sudden onset, worst of life, or deficits), infection,  or cavernous vein thrombosis.  Normal neuro exam and vital signs.  Patient since the age of 89 and has had migraines however in the last 2 weeks they seem to be getting worse. She never followed up with her neurologist or had an MRI done in the past. Patient's mom has a history of a cancerous brain tumor and patient is requesting imaging today because her headache returned after resolving when she received a migraine cocktail 2 days ago.  She has intermittent blurred vision but nothing that is continuous and is only on the right side where she has her headache. Patient's symptoms seem most consistent with an evolving migraine however given family history will do a CT to ensureoccupying lesions. Will give HA cocktail and on re-eval.  5:57 PM HA resolved.  CT neg.  Will d/c home to f/u with neurology.  Gwyneth Sprout, MD 07/08/14 1757  Gwyneth Sprout, MD 07/08/14 1759

## 2015-02-16 IMAGING — CT CT HEAD W/O CM
1 of 2 series · 16 of 30 positions shown, 20 images · non-contrast
Comparison: None.

CLINICAL DATA: Initial encounter for right temporal region
headache. Migraines for 6 years.

EXAM:
CT HEAD WITHOUT CONTRAST
TECHNIQUE: Contiguous axial images were obtained from the base of the skull
through the vertex without intravenous contrast.

[Series 3: head 5.0 h30s · axial · 0.40mm/px · z∈[+1629,+1744]mm · 16 of 27 slices shown, 20 images]
[im 2/27  brain]
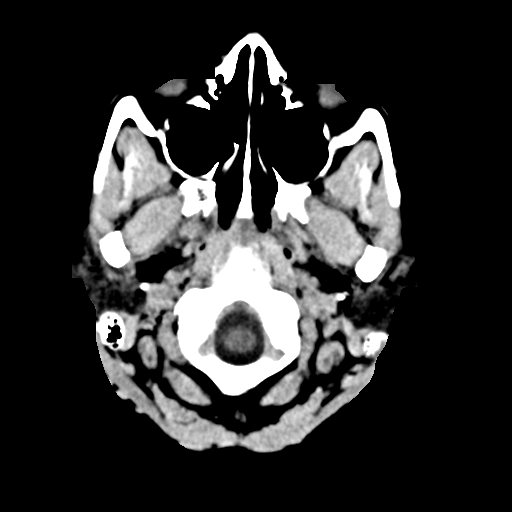
[im 2/27  bone]
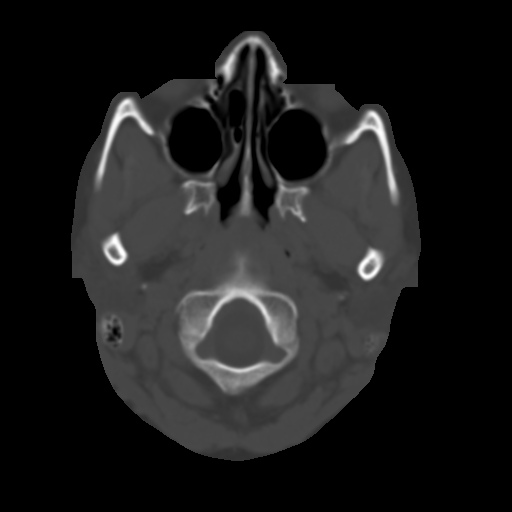
[im 4/27  brain]
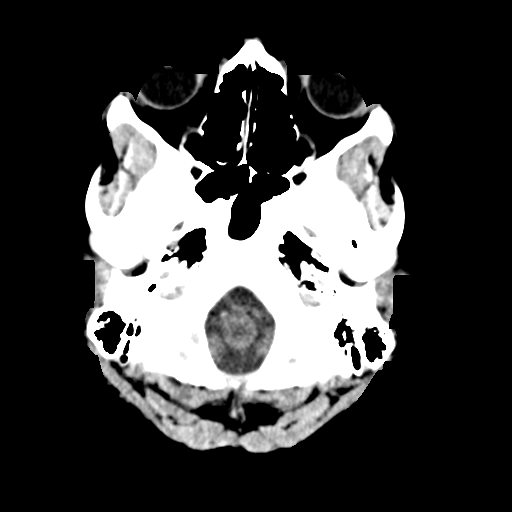
[im 5/27  brain]
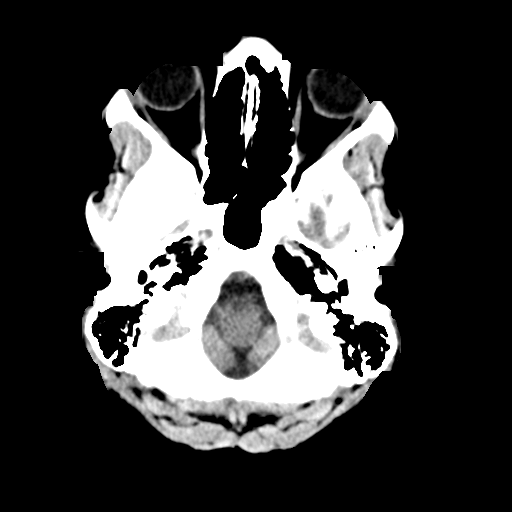
[im 7/27  brain]
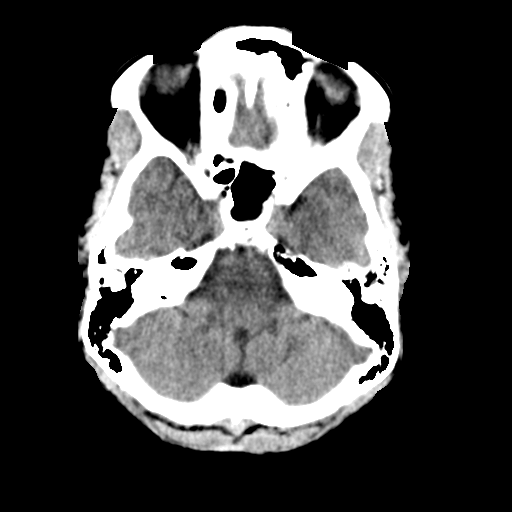
[im 8/27  brain]
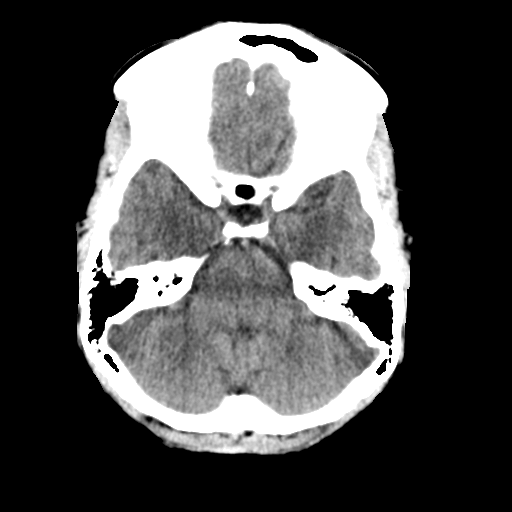
[im 8/27  bone]
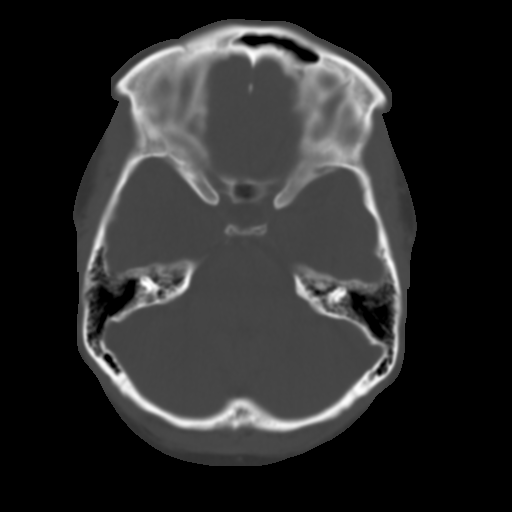
[im 9/27  brain]
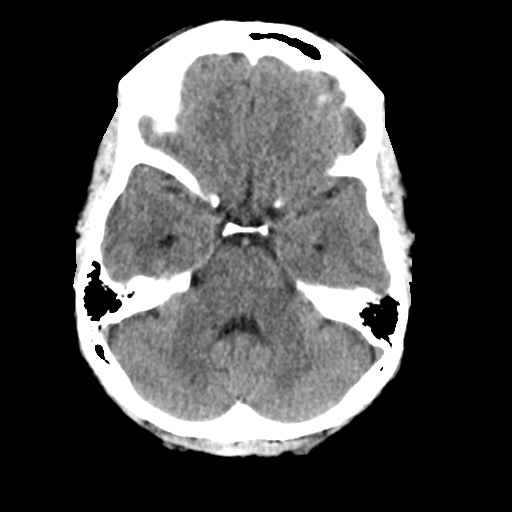
[im 11/27  brain]
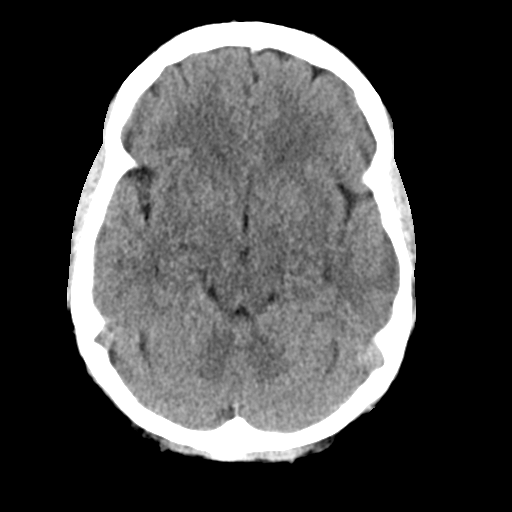
[im 12/27  brain]
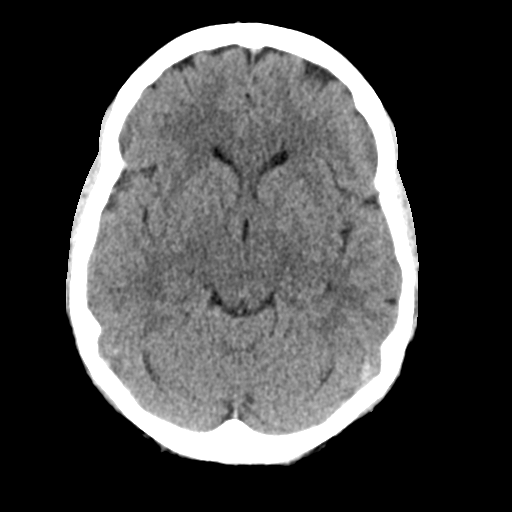
[im 15/27  brain]
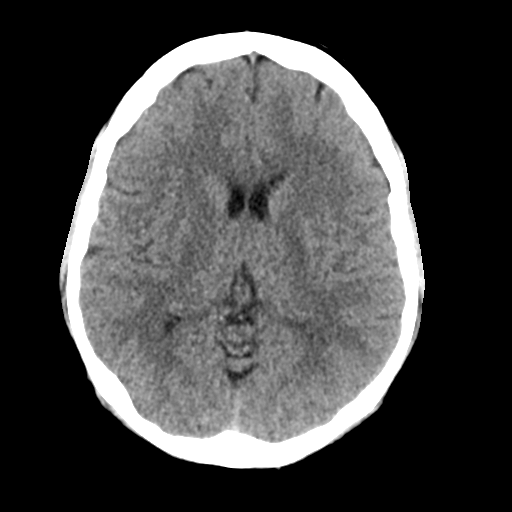
[im 15/27  bone]
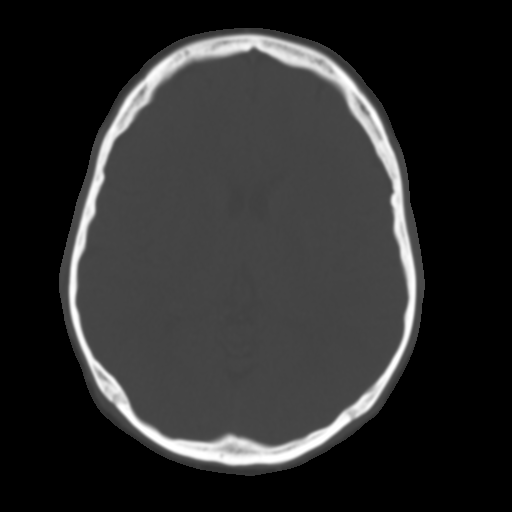
[im 16/27  brain]
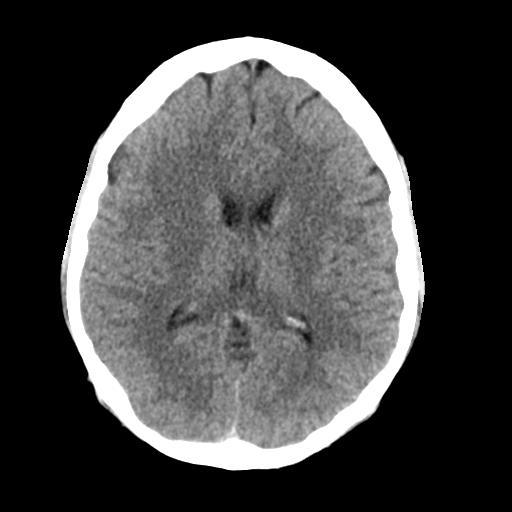
[im 18/27  brain]
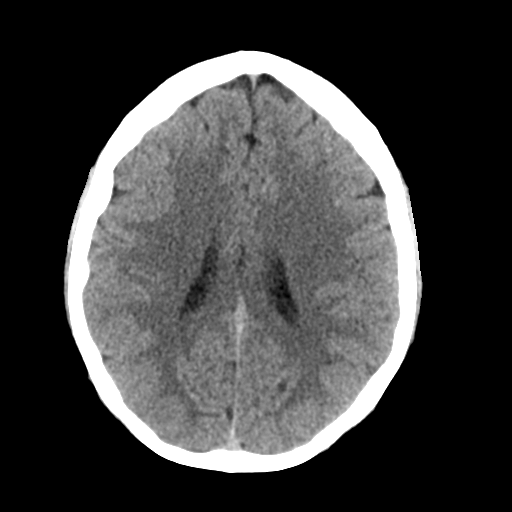
[im 19/27  brain]
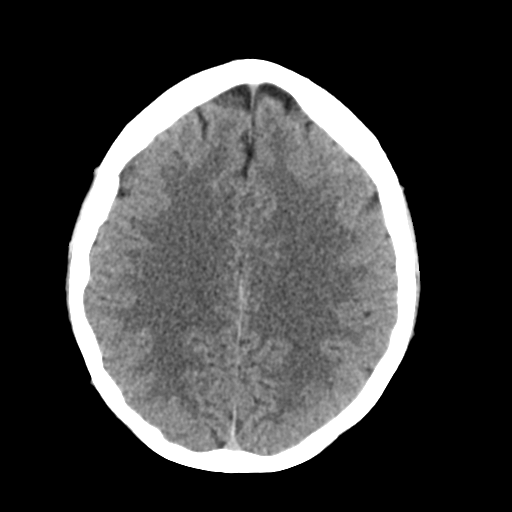
[im 20/27  brain]
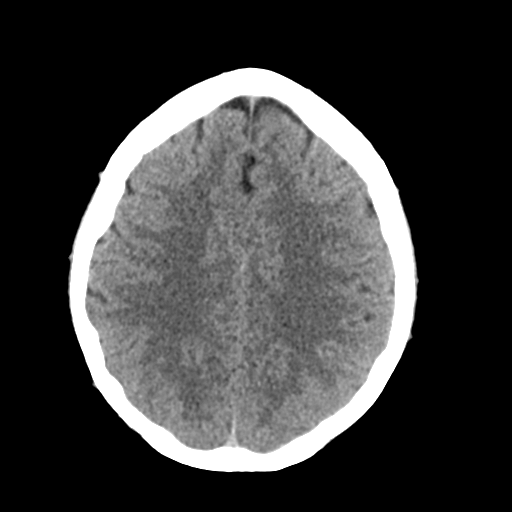
[im 20/27  bone]
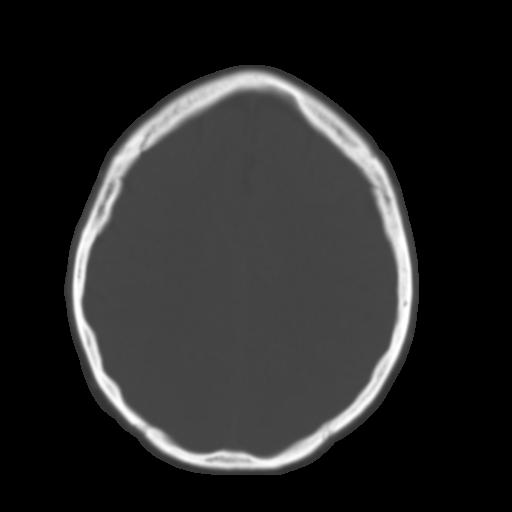
[im 22/27  brain]
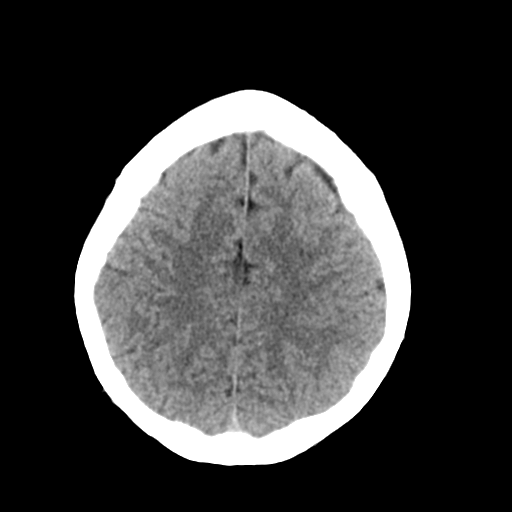
[im 23/27  brain]
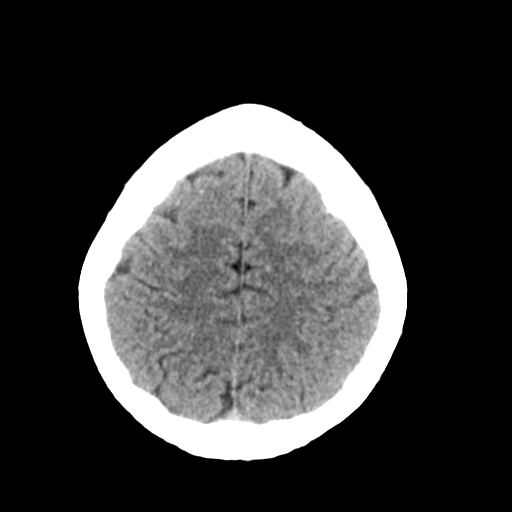
[im 25/27  brain]
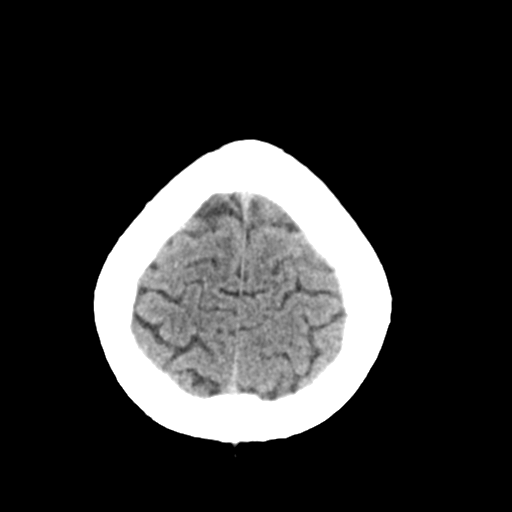

[16 of 30 positions shown; findings below may reference images not displayed]

FINDINGS: Sinuses/Soft tissues: Clear paranasal sinuses and mastoid air cells.

Intracranial: No mass lesion, hemorrhage, hydrocephalus, acute
infarct, intra-axial, or extra-axial fluid collection.
IMPRESSION: Normal head CT.

## 2018-01-30 ENCOUNTER — Encounter: Payer: Self-pay | Admitting: Obstetrics and Gynecology

## 2018-10-19 ENCOUNTER — Other Ambulatory Visit: Payer: Self-pay | Admitting: Family Medicine

## 2018-10-19 DIAGNOSIS — N6320 Unspecified lump in the left breast, unspecified quadrant: Secondary | ICD-10-CM

## 2018-10-27 ENCOUNTER — Other Ambulatory Visit: Payer: Self-pay

## 2018-10-27 ENCOUNTER — Ambulatory Visit
Admission: RE | Admit: 2018-10-27 | Discharge: 2018-10-27 | Disposition: A | Discharge status: Home / Self Care | Payer: Federal, State, Local not specified - PPO | Source: Ambulatory Visit | Attending: Family Medicine | Admitting: Family Medicine

## 2018-10-27 DIAGNOSIS — N6320 Unspecified lump in the left breast, unspecified quadrant: Secondary | ICD-10-CM

## 2019-01-28 ENCOUNTER — Encounter (HOSPITAL_COMMUNITY): Payer: Self-pay | Admitting: *Deleted

## 2019-02-02 ENCOUNTER — Encounter (HOSPITAL_COMMUNITY): Payer: Self-pay

## 2019-02-02 ENCOUNTER — Ambulatory Visit (HOSPITAL_COMMUNITY): Payer: BC Managed Care – PPO | Attending: Obstetrics and Gynecology

## 2019-02-08 ENCOUNTER — Other Ambulatory Visit (HOSPITAL_COMMUNITY): Payer: Self-pay | Admitting: Obstetrics and Gynecology

## 2019-03-11 LAB — OB RESULTS CONSOLE ABO/RH: RH Type: POSITIVE

## 2019-03-11 LAB — OB RESULTS CONSOLE RUBELLA ANTIBODY, IGM: Rubella: IMMUNE

## 2019-03-11 LAB — OB RESULTS CONSOLE GC/CHLAMYDIA
Chlamydia: NEGATIVE
Gonorrhea: NEGATIVE

## 2019-03-11 LAB — OB RESULTS CONSOLE HIV ANTIBODY (ROUTINE TESTING): HIV: NONREACTIVE

## 2019-03-11 LAB — OB RESULTS CONSOLE HEPATITIS B SURFACE ANTIGEN: Hepatitis B Surface Ag: NEGATIVE

## 2019-03-11 LAB — OB RESULTS CONSOLE ANTIBODY SCREEN: Antibody Screen: NEGATIVE

## 2019-03-11 LAB — OB RESULTS CONSOLE RPR: RPR: NONREACTIVE

## 2019-06-07 IMAGING — US ULTRASOUND LEFT BREAST LIMITED
1 series · 6 of 6 positions shown · non-contrast
Comparison: Previous exam(s).

CLINICAL DATA: 27-year-old patient presents for evaluation of a 1
year history of focal pain and lump in the lower inner quadrant of
the left breast. She had this area first evaluated in Tuesday August, 2017
in [HOSPITAL], and these images, including a bilateral mammogram
and left breast ultrasound and the accompanying report are available
for review.

[Series 1: ultrasound left breast limited · 0.07mm/px · 6 of 6 slices shown]
[im 1/6]
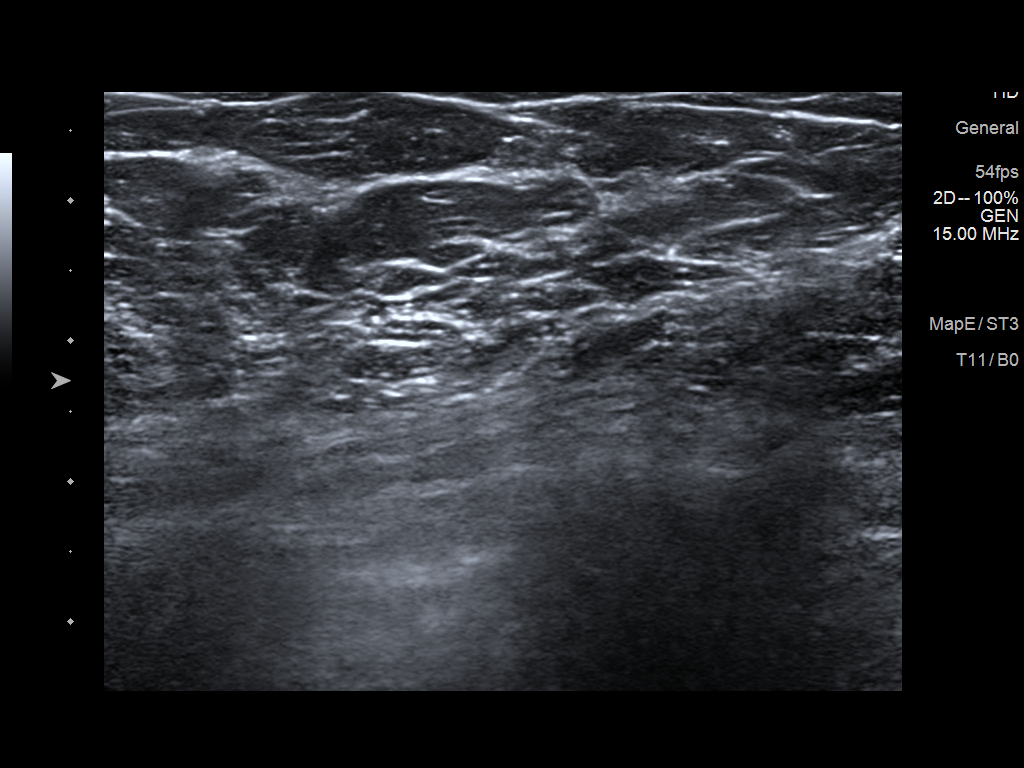
[im 2/6]
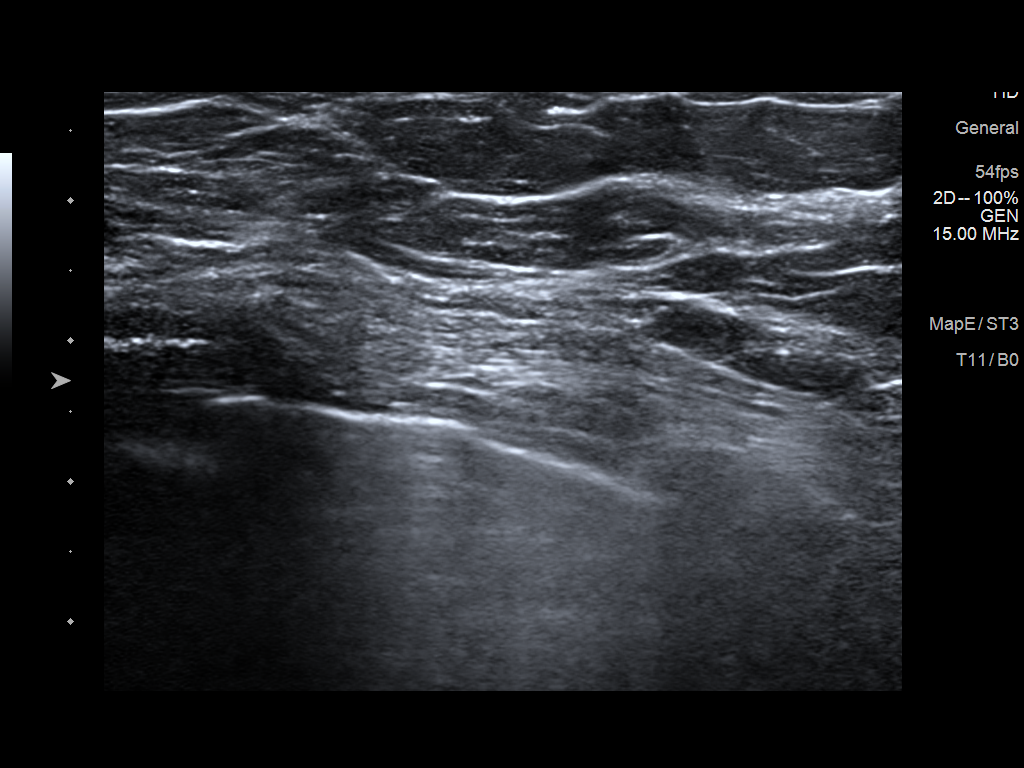
[im 3/6]
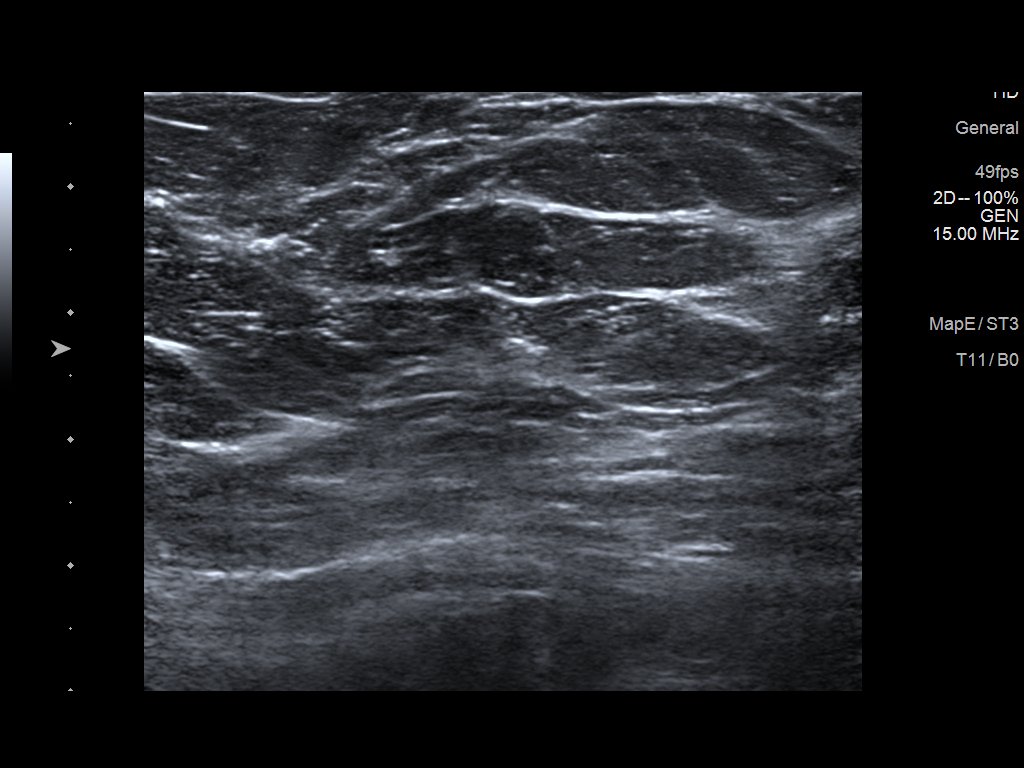
[im 4/6]
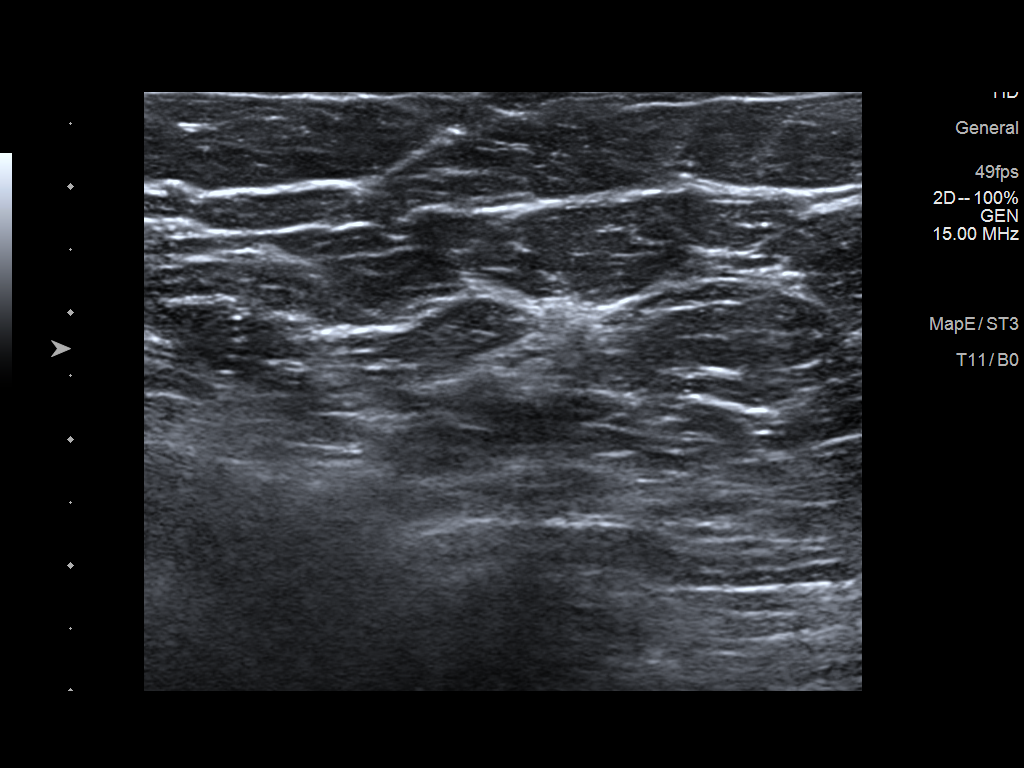
[im 5/6]
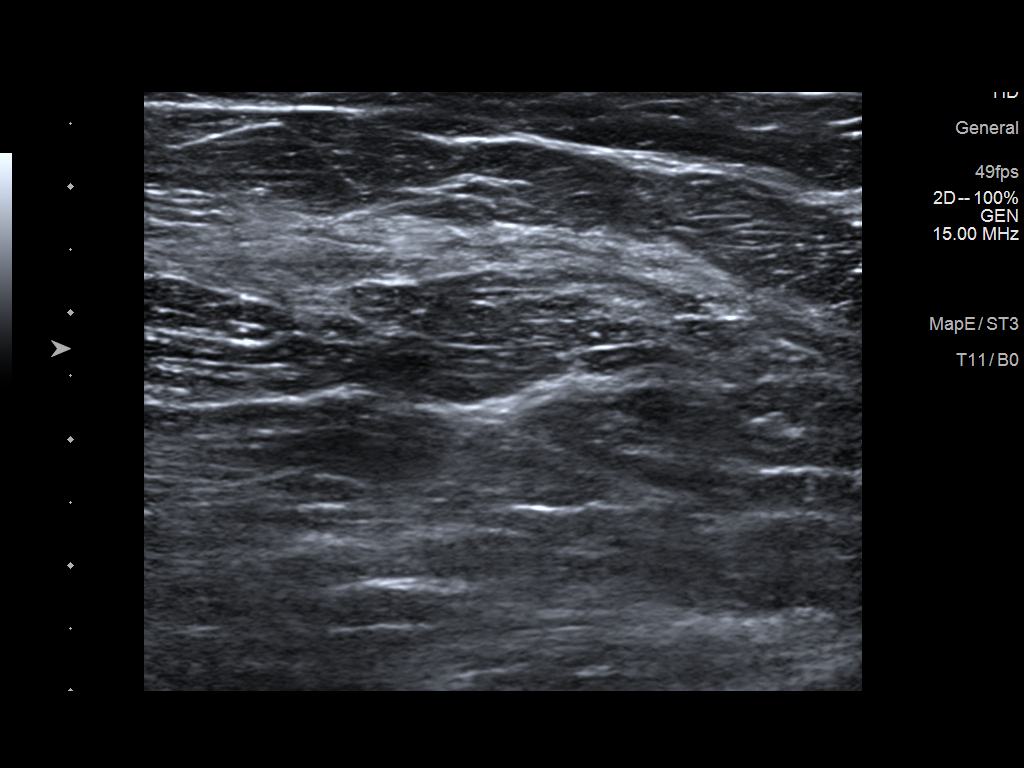
[im 6/6]
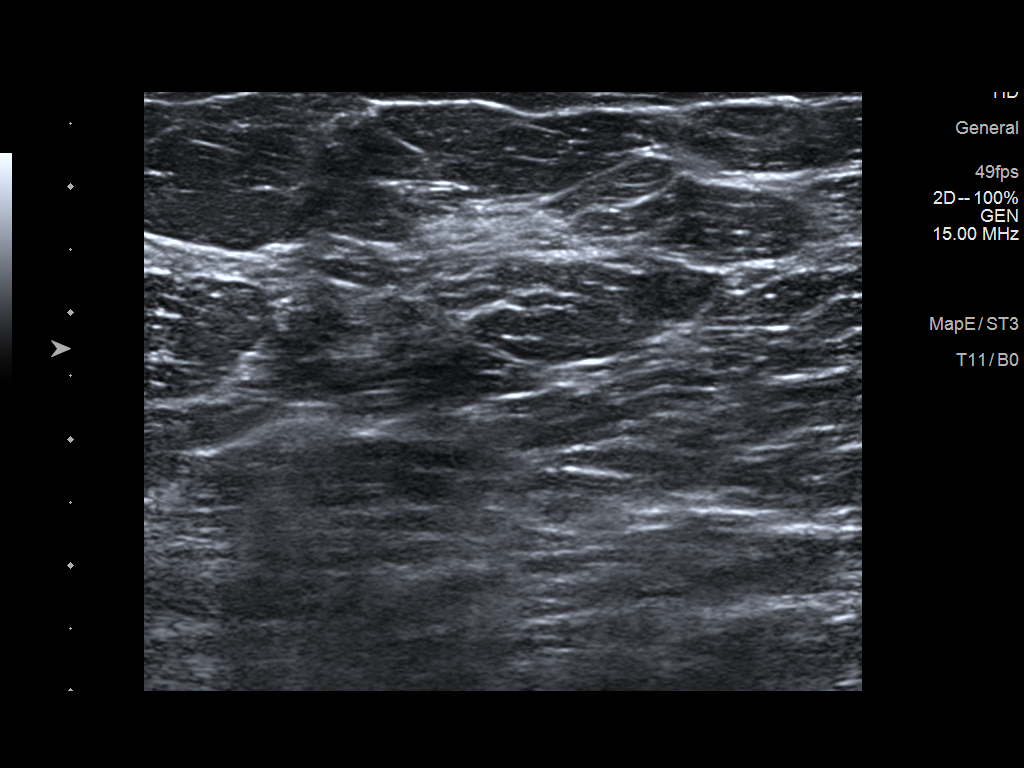

[6 of 6 positions shown; findings below may reference images not displayed]

The patient expresses concern and frustration that the pain has
persisted for a year and that she does not have a definite answer
for the cause of pain. She has stopped birth control to see if this
would help, and she has not noticed any change.

After the ultrasound was performed by the sonographer and results
were discussed with the patient by Dr. Luis Gerardo, the patient requested
a second opinion. Therefore, Dr. Lyn also performed ultrasound and
discussed the findings with the patient separately.

Please note that the order states that the lump is at 4 o'clock with
focal tenderness. The patient tells us that the focal tenderness and
lump are in the lower inner quadrant in the 8-9 o'clock positions.

EXAM:
ULTRASOUND OF THE LEFT BREAST
FINDINGS: On physical exam, no discrete mass or thickening is palpated on
physical exam today in the regions of patient concern at [DATE] to 9
o'clock position.

Targeted ultrasound is performed, showing normal appearing breast
parenchyma in the 8:30-9 o'clock position of the left breast ranging
from 7-12 cm from the nipple. Skin thickness is normal. No solid or
cystic mass, fluid collection, or abnormal shadowing is identified.
Additionally, the 4 o'clock region of the left breast was evaluated
with ultrasound and appears normal.
IMPRESSION: No sonographic evidence of malignancy, benign mass, or fluid
collection in the left breast. Imaging findings are normal. Dr.
Luis Gerardo and Dr. Lyn discussed findings with the patient today. We
discussed that often, regions of patient or clinical concern within
the breast have normal imaging findings. We also discussed that
sometimes pain may not be arising from the breast, but referred to
the breast.

RECOMMENDATION:
Clinical follow-up. Annual screening mammography is recommended at
age 40, unless new or progressive areas of concern in either breast
warrant earlier evaluation.

I have discussed the findings and recommendations with the patient.
Results were also provided in writing at the conclusion of the
visit. If applicable, a reminder letter will be sent to the patient
regarding the next appointment.

BI-RADS CATEGORY  1: Negative.

## 2019-07-27 ENCOUNTER — Other Ambulatory Visit: Payer: Self-pay | Admitting: Obstetrics & Gynecology

## 2019-07-29 ENCOUNTER — Telehealth (HOSPITAL_COMMUNITY): Payer: Self-pay | Admitting: *Deleted

## 2019-07-29 ENCOUNTER — Encounter (HOSPITAL_COMMUNITY): Payer: Self-pay | Admitting: *Deleted

## 2019-07-29 NOTE — Telephone Encounter (Signed)
Preadmission screen  

## 2019-08-05 LAB — OB RESULTS CONSOLE GBS: GBS: NEGATIVE

## 2019-08-18 ENCOUNTER — Other Ambulatory Visit (HOSPITAL_COMMUNITY)
Admission: RE | Admit: 2019-08-18 | Discharge: 2019-08-18 | Disposition: A | Discharge status: Home / Self Care | Payer: BC Managed Care – PPO | Source: Ambulatory Visit | Attending: Obstetrics and Gynecology | Admitting: Obstetrics and Gynecology

## 2019-08-18 DIAGNOSIS — Z20822 Contact with and (suspected) exposure to covid-19: Secondary | ICD-10-CM | POA: Diagnosis not present

## 2019-08-18 DIAGNOSIS — Z01812 Encounter for preprocedural laboratory examination: Secondary | ICD-10-CM | POA: Diagnosis present

## 2019-08-18 LAB — SARS CORONAVIRUS 2 (TAT 6-24 HRS): SARS Coronavirus 2: NEGATIVE

## 2019-08-19 NOTE — H&P (Signed)
OB ADMISSION/ HISTORY & PHYSICAL:  Admission Date: 08/20/2019 12:49 AM  Admit Diagnosis:    IOL for chronic HTN  Mikayla Castillo is a 29 y.o. female G2P0010 [redacted]w[redacted]d presenting for IOL for chronic HTN. Endorses active FM, denies LOF and vaginal bleeding.   History of current pregnancy: G2P0010  Patient entered care with [facility] at 14+6 wks.   EDC of 08/29/19 was established by [place] @ 14+6 wk    Anatomy scan:  19+6 wks, with normal findings and posterior placenta.   Antenatal testing: for SGA 4 th%ile @ 14 wks Last evaluation: 32 wks, EFW 61%ile Significant prenatal events: COVID positive during preg 9/29, tested neg 10/14 Patient Active Problem List   Diagnosis Date Noted  . Maternal chronic hypertension, third trimester 08/20/2019    Prenatal Labs: ABO, Rh: B/Positive/-- (09/17 0000) Antibody: Negative (09/17 0000) Rubella: Immune (09/17 0000)  RPR: Nonreactive (09/17 0000)  HBsAg: Negative (09/17 0000)  HIV: Non-reactive (09/17 0000) GTT: passed GBS:  negative  GC/CHL: neg/neg Genetics: Quad screen normal    OB History  Gravida Para Term Preterm AB Living  2       1 0  SAB TAB Ectopic Multiple Live Births    1          # Outcome Date GA Lbr Len/2nd Weight Sex Delivery Anes PTL Lv  2 Current           1 TAB             Medical / Surgical History: Past medical history:  Past Medical History:  Diagnosis Date  . Hypertension     Past surgical history:  Past Surgical History:  Procedure Laterality Date  . WISDOM TOOTH EXTRACTION     Family History:  Family History  Problem Relation Age of Onset  . Hypertension Father     Social History:  reports that she has never smoked. She has never used smokeless tobacco. She reports current alcohol use. She reports that she does not use drugs.  Allergies: Amoxicillin, Naproxen, and Penicillins   Current Medications at time of admission:  Prior to Admission medications   Medication Sig Start Date End Date Taking?  Authorizing Provider  ibuprofen (ADVIL,MOTRIN) 600 MG tablet Take 1 tablet (600 mg total) by mouth every 6 (six) hours as needed for headache. 07/05/14   Loren Racer, MD  Norethin Ace-Eth Estrad-FE (MINASTRIN 24 FE PO) Take 1 tablet by mouth daily.    [provider]    Review of Systems: Constitutional: Negative   HENT: Negative   Eyes: Negative   Respiratory: Negative   Cardiovascular: Negative   Gastrointestinal: Negative  Genitourinary: neg for bloody show, neg for LOF   Musculoskeletal: Negative   Skin: Negative   Neurological: Negative   Endo/Heme/Allergies: Negative   Psychiatric/Behavioral: Negative    Physical Exam: VS: Blood pressure 125/77, pulse 87, temperature 99.3 F (37.4 C), temperature source Oral, resp. rate 18, height 5\' 7"  (1.702 m), weight 108 kg, last menstrual period 11/22/2018, SpO2 97 %. AAO x3, no signs of distress Cardiovascular: RRR Respiratory: Lung fields clear to ausculation GU/GI: Abdomen gravid, non-tender, non-distended, active FM Extremities: trace edema, negative for pain, tenderness, and cords  Cervical exam:Dilation: Fingertip Effacement (%): Thick Station: -3  FHR: baseline rate 145 / variability moderate / accelerations present / absent decelerations TOCO: irreg   Prenatal Transfer Tool  Maternal Diabetes: No Genetic Screening: Normal Maternal Ultrasounds/Referrals: Normal Fetal Ultrasounds or other Referrals:  None Maternal Substance Abuse:  No Significant Maternal Medications:  Meds include: Other:  Labetalol 100 mg PO daily Significant Maternal Lab Results: Group B Strep negative    Assessment: 29 y.o. G2P0010 [redacted]w[redacted]d IOL for Chronic HTN Prev COVID pos 9/29, neg test 10/14  FHR category 1 GBS negative Pain management plan: IV sedation/Epidural   Plan:  Admit to L&D Routine admission orders Epidural PRN  Dr Alwyn Pea notified of admission and plan of care  Arrie Eastern CNM, MSN 08/19/2019 11:28 PM

## 2019-08-20 ENCOUNTER — Inpatient Hospital Stay (HOSPITAL_COMMUNITY): Payer: BC Managed Care – PPO | Admitting: Anesthesiology

## 2019-08-20 ENCOUNTER — Inpatient Hospital Stay (HOSPITAL_COMMUNITY): Payer: BC Managed Care – PPO

## 2019-08-20 ENCOUNTER — Encounter (HOSPITAL_COMMUNITY): Payer: Self-pay | Admitting: Obstetrics & Gynecology

## 2019-08-20 ENCOUNTER — Inpatient Hospital Stay (HOSPITAL_COMMUNITY)
Admission: AD | Admit: 2019-08-20 | Discharge: 2019-08-23 | DRG: 788 | Disposition: A | Discharge status: Home / Self Care | Payer: BC Managed Care – PPO | Attending: Obstetrics and Gynecology | Admitting: Obstetrics and Gynecology

## 2019-08-20 DIAGNOSIS — Z98891 History of uterine scar from previous surgery: Secondary | ICD-10-CM

## 2019-08-20 DIAGNOSIS — Z8616 Personal history of COVID-19: Secondary | ICD-10-CM | POA: Diagnosis not present

## 2019-08-20 DIAGNOSIS — O1002 Pre-existing essential hypertension complicating childbirth: Secondary | ICD-10-CM | POA: Diagnosis present

## 2019-08-20 DIAGNOSIS — Z3A38 38 weeks gestation of pregnancy: Secondary | ICD-10-CM

## 2019-08-20 DIAGNOSIS — O10913 Unspecified pre-existing hypertension complicating pregnancy, third trimester: Secondary | ICD-10-CM | POA: Diagnosis present

## 2019-08-20 LAB — COMPREHENSIVE METABOLIC PANEL
ALT: 13 U/L (ref 0–44)
AST: 16 U/L (ref 15–41)
Albumin: 2.4 g/dL — ABNORMAL LOW (ref 3.5–5.0)
Alkaline Phosphatase: 114 U/L (ref 38–126)
Anion gap: 12 (ref 5–15)
BUN: <5 mg/dL — ABNORMAL LOW (ref 6–20)
CO2: 17 mmol/L — ABNORMAL LOW (ref 22–32)
Calcium: 8.3 mg/dL — ABNORMAL LOW (ref 8.9–10.3)
Chloride: 103 mmol/L (ref 98–111)
Creatinine, Ser: 0.6 mg/dL (ref 0.44–1.00)
GFR calc Af Amer: >60 mL/min (ref >60)
GFR calc non Af Amer: >60 mL/min (ref >60)
Glucose, Bld: 109 mg/dL — ABNORMAL HIGH (ref 70–99)
Potassium: 3.4 mmol/L — ABNORMAL LOW (ref 3.5–5.1)
Sodium: 132 mmol/L — ABNORMAL LOW (ref 135–145)
Total Bilirubin: 0.6 mg/dL (ref 0.3–1.2)
Total Protein: 5.6 g/dL — ABNORMAL LOW (ref 6.5–8.1)

## 2019-08-20 LAB — CBC
HCT: 36.6 % (ref 36.0–46.0)
HCT: 39.9 % (ref 36.0–46.0)
Hemoglobin: 11.6 g/dL — ABNORMAL LOW (ref 12.0–15.0)
Hemoglobin: 12.7 g/dL (ref 12.0–15.0)
MCH: 24.7 pg — ABNORMAL LOW (ref 26.0–34.0)
MCH: 24.9 pg — ABNORMAL LOW (ref 26.0–34.0)
MCHC: 31.7 g/dL (ref 30.0–36.0)
MCHC: 31.8 g/dL (ref 30.0–36.0)
MCV: 78 fL — ABNORMAL LOW (ref 80.0–100.0)
MCV: 78.1 fL — ABNORMAL LOW (ref 80.0–100.0)
Platelets: 247 10*3/uL (ref 150–400)
Platelets: 233 10*3/uL (ref 150–400)
RBC: 4.69 MIL/uL (ref 3.87–5.11)
RBC: 5.11 MIL/uL (ref 3.87–5.11)
RDW: 13.2 % (ref 11.5–15.5)
RDW: 13 % (ref 11.5–15.5)
WBC: 6.8 10*3/uL (ref 4.0–10.5)
WBC: 6.8 10*3/uL (ref 4.0–10.5)
nRBC: 0 % (ref 0.0–0.2)
nRBC: 0 % (ref 0.0–0.2)

## 2019-08-20 LAB — PROTEIN / CREATININE RATIO, URINE
Creatinine, Urine: 63.56 mg/dL
Protein Creatinine Ratio: 0.14 mg/mg{Cre} (ref 0.00–0.15)
Total Protein, Urine: 9 mg/dL

## 2019-08-20 LAB — TYPE AND SCREEN
ABO/RH(D): B POS
Antibody Screen: NEGATIVE

## 2019-08-20 LAB — RPR: RPR Ser Ql: NONREACTIVE

## 2019-08-20 LAB — ABO/RH: ABO/RH(D): B POS

## 2019-08-20 MED ORDER — ACETAMINOPHEN 325 MG PO TABS
650.0000 mg | ORAL_TABLET | ORAL | Status: DC | PRN
  Administered 2019-08-20 (×2): 650 mg via ORAL
  Filled 2019-08-20 (×2): qty 2

## 2019-08-20 MED ORDER — DIPHENHYDRAMINE HCL 50 MG/ML IJ SOLN: 12.5000 mg | INTRAMUSCULAR | Status: DC | PRN

## 2019-08-20 MED ORDER — EPHEDRINE 5 MG/ML INJ: 10.0000 mg | INTRAVENOUS | Status: DC | PRN

## 2019-08-20 MED ORDER — MISOPROSTOL 25 MCG QUARTER TABLET
25.0000 ug | ORAL_TABLET | ORAL | Status: DC | PRN
  Administered 2019-08-20 (×2): 25 ug via VAGINAL
  Filled 2019-08-20 (×2): qty 1

## 2019-08-20 MED ORDER — LACTATED RINGERS IV SOLN: INTRAVENOUS | Status: DC

## 2019-08-20 MED ORDER — LACTATED RINGERS IV SOLN
500.0000 mL | INTRAVENOUS | Status: DC | PRN
  Administered 2019-08-20: 1000 mL via INTRAVENOUS

## 2019-08-20 MED ORDER — TERBUTALINE SULFATE 1 MG/ML IJ SOLN: 0.2500 mg | Freq: Once | INTRAMUSCULAR | Status: DC | PRN

## 2019-08-20 MED ORDER — FLEET ENEMA 7-19 GM/118ML RE ENEM: 1.0000 | ENEMA | RECTAL | Status: DC | PRN

## 2019-08-20 MED ORDER — PHENYLEPHRINE 40 MCG/ML (10ML) SYRINGE FOR IV PUSH (FOR BLOOD PRESSURE SUPPORT): 80.0000 ug | PREFILLED_SYRINGE | INTRAVENOUS | Status: DC | PRN

## 2019-08-20 MED ORDER — OXYTOCIN 40 UNITS IN NORMAL SALINE INFUSION - SIMPLE MED: 2.5000 [IU]/h | INTRAVENOUS | Status: DC

## 2019-08-20 MED ORDER — LIDOCAINE HCL (PF) 1 % IJ SOLN
INTRAMUSCULAR | Status: DC | PRN
  Administered 2019-08-20: 4 mL via EPIDURAL
  Administered 2019-08-20: 5 mL via EPIDURAL

## 2019-08-20 MED ORDER — FENTANYL CITRATE (PF) 100 MCG/2ML IJ SOLN
50.0000 ug | INTRAMUSCULAR | Status: DC | PRN
  Administered 2019-08-20: 100 ug via INTRAVENOUS
  Administered 2019-08-20: 50 ug via INTRAVENOUS
  Filled 2019-08-20 (×2): qty 2

## 2019-08-20 MED ORDER — LACTATED RINGERS IV SOLN
500.0000 mL | Freq: Once | INTRAVENOUS | Status: AC
  Administered 2019-08-20: 500 mL via INTRAVENOUS

## 2019-08-20 MED ORDER — ZOLPIDEM TARTRATE 5 MG PO TABS: 5.0000 mg | ORAL_TABLET | Freq: Every evening | ORAL | Status: DC | PRN

## 2019-08-20 MED ORDER — SOD CITRATE-CITRIC ACID 500-334 MG/5ML PO SOLN
30.0000 mL | ORAL | Status: DC | PRN
  Administered 2019-08-20 (×2): 30 mL via ORAL
  Filled 2019-08-20 (×3): qty 30

## 2019-08-20 MED ORDER — SODIUM CHLORIDE (PF) 0.9 % IJ SOLN
INTRAMUSCULAR | Status: DC | PRN
  Administered 2019-08-20: 12 mL/h via EPIDURAL

## 2019-08-20 MED ORDER — FENTANYL-BUPIVACAINE-NACL 0.5-0.125-0.9 MG/250ML-% EP SOLN
12.0000 mL/h | EPIDURAL | Status: DC | PRN
  Filled 2019-08-20: qty 250

## 2019-08-20 MED ORDER — OXYTOCIN BOLUS FROM INFUSION: 500.0000 mL | Freq: Once | INTRAVENOUS | Status: DC

## 2019-08-20 MED ORDER — OXYTOCIN 40 UNITS IN NORMAL SALINE INFUSION - SIMPLE MED
1.0000 m[IU]/min | INTRAVENOUS | Status: DC
  Administered 2019-08-20: 1 m[IU]/min via INTRAVENOUS
  Filled 2019-08-20: qty 1000

## 2019-08-20 MED ORDER — LIDOCAINE HCL (PF) 1 % IJ SOLN: 30.0000 mL | INTRAMUSCULAR | Status: DC | PRN

## 2019-08-20 MED ORDER — OXYTOCIN 40 UNITS IN NORMAL SALINE INFUSION - SIMPLE MED: 1.0000 m[IU]/min | INTRAVENOUS | Status: DC

## 2019-08-20 MED ORDER — ONDANSETRON HCL 4 MG/2ML IJ SOLN: 4.0000 mg | Freq: Four times a day (QID) | INTRAMUSCULAR | Status: DC | PRN

## 2019-08-20 NOTE — Progress Notes (Signed)
Mikayla Castillo is a 29 y.o. G2P0010 at [redacted]w[redacted]d admitted for induction of labor due to Chronic HTN.  Subjective: reviewed process for Mikayla Castillo cath insertion, pt agrees. Pain management options discussed. Currently comfortable, but request IV sedation for balloon placement.   Objective: BP (!) 125/49   Pulse 77   Temp 99 F (37.2 C) (Oral)   Resp 18   Ht 5\' 7"  (1.702 m)   Wt 108 kg   LMP 11/22/2018   SpO2 97%   BMI 37.31 kg/m  No intake/output data recorded. No intake/output data recorded.  FHT:  FHR: 140 bpm, variability: moderate,  accelerations:  Present,  decelerations:  Absent UC:   1.5-3 min SVE:   Dilation: 1 Effacement (%): 50 Station: -2 Exam by:: [email or web address], CNM   Labs: Lab Results  Component Value Date   WBC 6.8 08/20/2019   HGB 11.6 (L) 08/20/2019   HCT 36.6 08/20/2019   MCV 78.0 (L) 08/20/2019   PLT 247 08/20/2019    Assessment / Plan: Induction of labor due to chronic HTN, stable on Labetalol 100mg  daily,  progressing well on pitocin  Labor: S/P Cytotec x2 doses, unsuccessful Cook balloon, starting Pitocin  Fetal Wellbeing:  Category I Pain Control:  IV pain meds I/D:  GBS negative Anticipated MOD:  NSVD  08/22/2019 MSN, CNM 08/20/2019, 1:23 PM

## 2019-08-20 NOTE — Progress Notes (Signed)
Labor Progress Note  Mikayla Castillo is a 29 y.o. female G67P0010 [redacted]w[redacted]d presenting for IOL for chronic HTN, controlled on labetalol during pregnancy 100mg  daily.   Subjective: I assumed care for pt resting bed in stable condition. Pt denies having cxt in last 30 mins, but was having cxt pain that felt intense. Pt expressed feeling fatigued about being here and slightly frustrated at the amount of time it is taking for her induction, induction reviewed, Foley bulb and decels included R/B/A reviewed, pt verbalized understand and denied any questions. Pt ok with foley bulb placement but desired fentanyl prior to placement for pain. Discussed plan of care and pain management, pt desired epidural if pain becomes more intense. Pt currently has mild HA and took tylenol which helped relieve her headches earlier, pt endorses getting frequent headaches during early pregnancy. Denies RUQ pain or vision changes.  Patient Active Problem List   Diagnosis Date Noted  . Maternal chronic hypertension, third trimester 08/20/2019   Objective: BP (!) 108/55 (BP Location: Left Arm)   Pulse 80   Temp 98.2 F (36.8 C) (Oral)   Resp 18   Ht 5\' 7"  (1.702 m)   Wt 108 kg   LMP 11/22/2018   SpO2 97%   BMI 37.31 kg/m  No intake/output data recorded. No intake/output data recorded. NST: FHR baseline 150 bpm, Variability: moderate, Accelerations:present, Decelerations:  Variables noted and x2 prolonged decels to nadir of 80s with return to baseline after 4 mins with position change and fluid bolus x2. = Cat 2/Reactive CTX:  irregular, every 4-10 minutes Uterus gravid, soft non tender, moderate to palpate with contractions.  SVE:  Dilation: 2 Effacement (%): 60 Station: -2 Exam by:: J Eleasha Cataldo CNM Pitocin at (Now=0, Was up to 8 and turned off at 1800 on 2/26 due to decels) mUn/min  [facility] foley double bubbled placed with slight difficulty, uterine and vaginal balloons instilled with 18mls NS, pt tolerated well  with pain medication given first.   Assessment:  Mikayla Castillo is a 29 y.o. female G2P0010 [redacted]w[redacted]d presenting for IOL for chronic HTN, controlled on labetalol during pregnancy 100mg  daily. Progressing in early labor, foley bulb placed with slight difficulty, pt tolerated well. Irregular cxt spaced 4-38mins.  Patient Active Problem List   Diagnosis Date Noted  . Maternal chronic hypertension, third trimester 08/20/2019   NICHD: Category 2  Category 2 with active intrauterine resuscitative measures fluid bolus and position change. x2 prolonged x4-5 mins nadir of 80-90s @ 1853 & 1954  Membranes:  Intact, no s/s of infection  Induction:    Cytotec x2 @   Foley Bulb: inserted @ 2000 on 2/26  Pitocin - Now=0, Was up to 8 and turned off at 1800 on 2/26 due to decels  Pain management:               IV pain management: x 2 @ [place] on 2/26             Epidural placement: PRN  GBS Negative  HA: Tylenol given @ [place], HA resolved  CHTN: mild HA, denies RUQ pain or vision changes, BP 108/55, no meds ordered.    Plan: Continue labor plan Continuous monitoring Rest Ambulate HA: Tylenol, and monitor for resolution.  CHTN: NO meds order BP normotensive, monitor, if elevated BP start labetalol.  Frequent position changes to facilitate fetal rotation and descent. Will reassess with cervical exam at when necessary or indicated, pt request less checks due to  painful checks.  Anticipate foley bulb falling out in next 12 hours Allow fetus to recover from decels ANticipate pitocin or AROM after foley bulb falls out Anticipate labor progression and vaginal delivery.   Md [place] aware of plan and verbalized agreement.   Dale Alma, NP-C, CNM, MSN 08/20/2019. 8:39 PM

## 2019-08-20 NOTE — Progress Notes (Signed)
Subjective:    Becoming uncomfortable w/ ctx, using birthing ball and freedom of movement for comfort. C/O mild HA earlier, relieved w/ Tylenol and snack.   Objective:    VS: BP 124/69   Pulse 77   Temp 98.2 F (36.8 C) (Oral)   Resp 20   Ht 5\' 7"  (1.702 m)   Wt 108 kg   LMP 11/22/2018   SpO2 97%   BMI 37.31 kg/m  FHR : baseline 145 / variability moderate / accelerations present / variable decelerations Toco: contractions every 2 minutes/moderate per palpation Membranes: Intact Dilation: 1 Effacement (%): 50 Cervical Position: Middle(maternal left) Station: -2 Presentation: Vertex Exam by:: [email or web address], CNM   Pitocin 8 mU/min  Assessment/Plan:   29 y.o. G2P0010 [redacted]w[redacted]d  Labor: S/P Cytotec x2, unable to place cervical balloon, Pit started @ 1310 Preeclampsia:  no signs or symptoms of toxicity and normotensive CMP and PCR pending Fetal Wellbeing:  Category I, Category II and Returned to Cat 1 after interventions Pain Control:  position change, birthing ball, planning epidural I/D:  GBS neg Anticipated MOD:  NSVD  [redacted]w[redacted]d CNM, MSN 08/20/2019 6:21 PM

## 2019-08-20 NOTE — Progress Notes (Signed)
Pitocin stopped with variable decels. No cervical change since last exam @ 1300. Consider repeat Cytotec when ctx <5 in 10 min.   Cat 1 FHR tracing currently.   Rhea Pink, MSN, CNM 08/20/2019 6:29 PM

## 2019-08-20 NOTE — Anesthesia Preprocedure Evaluation (Signed)
Anesthesia Evaluation  Patient identified by MRN, date of birth, ID band Patient awake    Reviewed: Allergy & Precautions, NPO status , Patient's Chart, lab work & pertinent test results  History of Anesthesia Complications Negative for: history of anesthetic complications  Airway Mallampati: II   Neck ROM: Full    Dental   Pulmonary neg pulmonary ROS,    Pulmonary exam normal        Cardiovascular hypertension, Pt. on medications Normal cardiovascular exam     Neuro/Psych negative neurological ROS  negative psych ROS   GI/Hepatic negative GI ROS, Neg liver ROS,   Endo/Other   Obesity Hyponatremia Hypokalemia Hypocalcemia   Renal/GU negative Renal ROS     Musculoskeletal negative musculoskeletal ROS (+)   Abdominal (+) + obese,   Peds  Hematology negative hematology ROS (+)   Anesthesia Other Findings Covid neg 08/18/19   Reproductive/Obstetrics (+) Pregnancy                             Anesthesia Physical Anesthesia Plan  ASA: II  Anesthesia Plan: Epidural   Post-op Pain Management:    Induction:   PONV Risk Score and Plan: 2 and Treatment may vary due to age or medical condition  Airway Management Planned: Natural Airway  Additional Equipment: None  Intra-op Plan:   Post-operative Plan:   Informed Consent: I have reviewed the patients History and Physical, chart, labs and discussed the procedure including the risks, benefits and alternatives for the proposed anesthesia with the patient or authorized representative who has indicated his/her understanding and acceptance.       Plan Discussed with: Anesthesiologist  Anesthesia Plan Comments: (Labs reviewed. Platelets acceptable, patient not taking any blood thinning medications. Per RN, FHR tracing reported to be stable enough for sitting procedure. Risks and benefits discussed with patient, including PDPH,  backache, epidural hematoma, failed epidural, blood pressure changes, allergic reaction, and nerve injury. Patient expressed understanding and wished to proceed.)        Anesthesia Quick Evaluation

## 2019-08-20 NOTE — Anesthesia Procedure Notes (Signed)
Epidural Patient location during procedure: OB Start time: 08/20/2019 9:34 PM End time: 08/20/2019 9:39 PM  Staffing Anesthesiologist: Beryle Lathe, MD Performed: anesthesiologist   Preanesthetic Checklist Completed: patient identified, IV checked, risks and benefits discussed, monitors and equipment checked, pre-op evaluation and timeout performed  Epidural Patient position: sitting Prep: DuraPrep Patient monitoring: continuous pulse ox and blood pressure Approach: midline Location: L2-L3 Injection technique: LOR saline  Needle:  Needle type: Tuohy  Needle gauge: 17 G Needle length: 9 cm Needle insertion depth: 9 cm Catheter size: 19 Gauge Catheter at skin depth: 14 cm Test dose: negative and Other (1% lidocaine)  Assessment Events: blood not aspirated  Additional Notes Patient identified. Risks including, but not limited to, bleeding, infection, nerve damage, paralysis, inadequate analgesia, blood pressure changes, nausea, vomiting, allergic reaction, postpartum back pain, itching, and headache were discussed. Patient expressed understanding and wished to proceed. Sterile prep and drape, including hand hygiene, mask, and sterile gloves were used. The patient was positioned and the spine was prepped. The skin was anesthetized with lidocaine. No paraesthesia or other complication noted. The patient did not experience any signs of intravascular injection such as tinnitus or metallic taste in mouth, nor signs of intrathecal spread such as rapid motor block. Please see nursing notes for vital signs. The patient tolerated the procedure well.   Leslye Peer, MDReason for block:procedure for pain

## 2019-08-21 ENCOUNTER — Encounter (HOSPITAL_COMMUNITY)
Admission: AD | Disposition: A | Discharge status: Home / Self Care | Payer: Self-pay | Source: Home / Self Care | Attending: Obstetrics and Gynecology

## 2019-08-21 ENCOUNTER — Encounter (HOSPITAL_COMMUNITY): Payer: Self-pay | Admitting: Obstetrics & Gynecology

## 2019-08-21 ENCOUNTER — Other Ambulatory Visit: Payer: Self-pay

## 2019-08-21 LAB — CBC
HCT: 39.3 % (ref 36.0–46.0)
Hemoglobin: 12.6 g/dL (ref 12.0–15.0)
MCH: 25 pg — ABNORMAL LOW (ref 26.0–34.0)
MCHC: 32.1 g/dL (ref 30.0–36.0)
MCV: 78.1 fL — ABNORMAL LOW (ref 80.0–100.0)
Platelets: 209 10*3/uL (ref 150–400)
RBC: 5.03 MIL/uL (ref 3.87–5.11)
RDW: 13.1 % (ref 11.5–15.5)
WBC: 10.7 10*3/uL — ABNORMAL HIGH (ref 4.0–10.5)
nRBC: 0 % (ref 0.0–0.2)

## 2019-08-21 SURGERY — Surgical Case
Anesthesia: Epidural

## 2019-08-21 MED ORDER — CLINDAMYCIN PHOSPHATE 900 MG/50ML IV SOLN: 900.0000 mg | INTRAVENOUS | Status: DC

## 2019-08-21 MED ORDER — LIDOCAINE-EPINEPHRINE (PF) 2 %-1:200000 IJ SOLN
INTRAMUSCULAR | Status: DC | PRN
  Administered 2019-08-21 (×3): 5 mL via EPIDURAL

## 2019-08-21 MED ORDER — STERILE WATER FOR IRRIGATION IR SOLN
Status: DC | PRN
  Administered 2019-08-21: 1000 mL

## 2019-08-21 MED ORDER — MORPHINE SULFATE (PF) 0.5 MG/ML IJ SOLN
INTRAMUSCULAR | Status: DC | PRN
  Administered 2019-08-21: 3 mg via EPIDURAL

## 2019-08-21 MED ORDER — DIBUCAINE (PERIANAL) 1 % EX OINT: 1.0000 "application " | TOPICAL_OINTMENT | CUTANEOUS | Status: DC | PRN

## 2019-08-21 MED ORDER — SODIUM CHLORIDE 0.9 % IV SOLN: INTRAVENOUS | Status: DC | PRN

## 2019-08-21 MED ORDER — SIMETHICONE 80 MG PO CHEW
80.0000 mg | CHEWABLE_TABLET | Freq: Three times a day (TID) | ORAL | Status: DC
  Administered 2019-08-21 – 2019-08-23 (×6): 80 mg via ORAL
  Filled 2019-08-21 (×6): qty 1

## 2019-08-21 MED ORDER — LIDOCAINE-EPINEPHRINE (PF) 2 %-1:200000 IJ SOLN
INTRAMUSCULAR | Status: AC
  Filled 2019-08-21: qty 10

## 2019-08-21 MED ORDER — SENNOSIDES-DOCUSATE SODIUM 8.6-50 MG PO TABS
2.0000 | ORAL_TABLET | ORAL | Status: DC
  Administered 2019-08-21 – 2019-08-22 (×2): 2 via ORAL
  Filled 2019-08-21 (×2): qty 2

## 2019-08-21 MED ORDER — LACTATED RINGERS IV SOLN: INTRAVENOUS | Status: DC | PRN

## 2019-08-21 MED ORDER — OXYCODONE HCL 5 MG PO TABS
5.0000 mg | ORAL_TABLET | ORAL | Status: DC | PRN
  Administered 2019-08-22 – 2019-08-23 (×4): 5 mg via ORAL
  Filled 2019-08-21 (×4): qty 1
  Filled 2019-08-21: qty 2

## 2019-08-21 MED ORDER — SIMETHICONE 80 MG PO CHEW
80.0000 mg | CHEWABLE_TABLET | ORAL | Status: DC
  Administered 2019-08-21: 80 mg via ORAL
  Filled 2019-08-21: qty 1

## 2019-08-21 MED ORDER — NALBUPHINE HCL 10 MG/ML IJ SOLN: 5.0000 mg | INTRAMUSCULAR | Status: DC | PRN

## 2019-08-21 MED ORDER — KETOROLAC TROMETHAMINE 30 MG/ML IJ SOLN
30.0000 mg | Freq: Four times a day (QID) | INTRAMUSCULAR | Status: AC | PRN
  Administered 2019-08-21 – 2019-08-22 (×3): 30 mg via INTRAVENOUS
  Filled 2019-08-21 (×4): qty 1

## 2019-08-21 MED ORDER — PRENATAL MULTIVITAMIN CH
1.0000 | ORAL_TABLET | Freq: Every day | ORAL | Status: DC
  Administered 2019-08-22 – 2019-08-23 (×2): 1 via ORAL
  Filled 2019-08-21 (×2): qty 1

## 2019-08-21 MED ORDER — CLINDAMYCIN PHOSPHATE 900 MG/50ML IV SOLN
INTRAVENOUS | Status: AC
  Filled 2019-08-21: qty 50

## 2019-08-21 MED ORDER — OXYTOCIN 40 UNITS IN NORMAL SALINE INFUSION - SIMPLE MED
2.5000 [IU]/h | INTRAVENOUS | Status: AC
  Administered 2019-08-21: 2.5 [IU]/h via INTRAVENOUS
  Filled 2019-08-21: qty 1000

## 2019-08-21 MED ORDER — COCONUT OIL OIL: 1.0000 "application " | TOPICAL_OIL | Status: DC | PRN

## 2019-08-21 MED ORDER — ONDANSETRON HCL 4 MG/2ML IJ SOLN: 4.0000 mg | Freq: Three times a day (TID) | INTRAMUSCULAR | Status: DC | PRN

## 2019-08-21 MED ORDER — KETOROLAC TROMETHAMINE 30 MG/ML IJ SOLN
INTRAMUSCULAR | Status: AC
  Filled 2019-08-21: qty 1

## 2019-08-21 MED ORDER — NALOXONE HCL 0.4 MG/ML IJ SOLN: 0.4000 mg | INTRAMUSCULAR | Status: DC | PRN

## 2019-08-21 MED ORDER — ONDANSETRON HCL 4 MG/2ML IJ SOLN
INTRAMUSCULAR | Status: DC | PRN
  Administered 2019-08-21: 4 mg via INTRAVENOUS

## 2019-08-21 MED ORDER — DIPHENHYDRAMINE HCL 25 MG PO CAPS: 25.0000 mg | ORAL_CAPSULE | Freq: Four times a day (QID) | ORAL | Status: DC | PRN

## 2019-08-21 MED ORDER — MEPERIDINE HCL 25 MG/ML IJ SOLN
6.2500 mg | INTRAMUSCULAR | Status: DC | PRN
  Administered 2019-08-21: 15:00:00 6.25 mg via INTRAVENOUS

## 2019-08-21 MED ORDER — TETANUS-DIPHTH-ACELL PERTUSSIS 5-2.5-18.5 LF-MCG/0.5 IM SUSP: 0.5000 mL | Freq: Once | INTRAMUSCULAR | Status: DC

## 2019-08-21 MED ORDER — SODIUM CHLORIDE 0.9% FLUSH: 3.0000 mL | INTRAVENOUS | Status: DC | PRN

## 2019-08-21 MED ORDER — ONDANSETRON HCL 4 MG/2ML IJ SOLN: 4.0000 mg | Freq: Four times a day (QID) | INTRAMUSCULAR | Status: DC | PRN

## 2019-08-21 MED ORDER — KETOROLAC TROMETHAMINE 30 MG/ML IJ SOLN
30.0000 mg | Freq: Four times a day (QID) | INTRAMUSCULAR | Status: AC | PRN
  Administered 2019-08-21: 30 mg via INTRAMUSCULAR

## 2019-08-21 MED ORDER — NALBUPHINE HCL 10 MG/ML IJ SOLN
5.0000 mg | INTRAMUSCULAR | Status: DC | PRN
  Administered 2019-08-22: 5 mg via INTRAVENOUS
  Filled 2019-08-21: qty 1

## 2019-08-21 MED ORDER — NALBUPHINE HCL 10 MG/ML IJ SOLN: 5.0000 mg | Freq: Once | INTRAMUSCULAR | Status: DC | PRN

## 2019-08-21 MED ORDER — ONDANSETRON HCL 4 MG/2ML IJ SOLN
INTRAMUSCULAR | Status: AC
  Filled 2019-08-21: qty 2

## 2019-08-21 MED ORDER — ZOLPIDEM TARTRATE 5 MG PO TABS: 5.0000 mg | ORAL_TABLET | Freq: Every evening | ORAL | Status: DC | PRN

## 2019-08-21 MED ORDER — LACTATED RINGERS IV SOLN: INTRAVENOUS | Status: DC

## 2019-08-21 MED ORDER — GENTAMICIN SULFATE 40 MG/ML IJ SOLN
5.0000 mg/kg | INTRAVENOUS | Status: AC
  Administered 2019-08-21: 13:00:00 400 mg via INTRAVENOUS
  Filled 2019-08-21: qty 10

## 2019-08-21 MED ORDER — MEPERIDINE HCL 25 MG/ML IJ SOLN
INTRAMUSCULAR | Status: AC
  Filled 2019-08-21: qty 1

## 2019-08-21 MED ORDER — MENTHOL 3 MG MT LOZG: 1.0000 | LOZENGE | OROMUCOSAL | Status: DC | PRN

## 2019-08-21 MED ORDER — OXYCODONE HCL 5 MG PO TABS: 5.0000 mg | ORAL_TABLET | Freq: Once | ORAL | Status: DC | PRN

## 2019-08-21 MED ORDER — OXYTOCIN 40 UNITS IN NORMAL SALINE INFUSION - SIMPLE MED
1.0000 m[IU]/min | INTRAVENOUS | Status: DC
  Administered 2019-08-21: 1 m[IU]/min via INTRAVENOUS

## 2019-08-21 MED ORDER — SODIUM CHLORIDE 0.9 % IR SOLN
Status: DC | PRN
  Administered 2019-08-21: 1

## 2019-08-21 MED ORDER — CLINDAMYCIN PHOSPHATE 900 MG/50ML IV SOLN
INTRAVENOUS | Status: DC | PRN
  Administered 2019-08-21: 900 mg via INTRAVENOUS

## 2019-08-21 MED ORDER — OXYCODONE HCL 5 MG/5ML PO SOLN: 5.0000 mg | Freq: Once | ORAL | Status: DC | PRN

## 2019-08-21 MED ORDER — SIMETHICONE 80 MG PO CHEW
80.0000 mg | CHEWABLE_TABLET | ORAL | Status: DC | PRN
  Administered 2019-08-22: 80 mg via ORAL
  Filled 2019-08-21: qty 1

## 2019-08-21 MED ORDER — OXYTOCIN 40 UNITS IN NORMAL SALINE INFUSION - SIMPLE MED
INTRAVENOUS | Status: AC
  Filled 2019-08-21: qty 1000

## 2019-08-21 MED ORDER — DEXAMETHASONE SODIUM PHOSPHATE 10 MG/ML IJ SOLN
INTRAMUSCULAR | Status: AC
  Filled 2019-08-21: qty 1

## 2019-08-21 MED ORDER — ACETAMINOPHEN 500 MG PO TABS
1000.0000 mg | ORAL_TABLET | Freq: Three times a day (TID) | ORAL | Status: DC
  Administered 2019-08-21 – 2019-08-23 (×6): 1000 mg via ORAL
  Filled 2019-08-21 (×6): qty 2

## 2019-08-21 MED ORDER — OXYTOCIN 40 UNITS IN NORMAL SALINE INFUSION - SIMPLE MED
INTRAVENOUS | Status: DC | PRN
  Administered 2019-08-21: 40 [IU] via INTRAVENOUS

## 2019-08-21 MED ORDER — NALOXONE HCL 4 MG/10ML IJ SOLN
1.0000 ug/kg/h | INTRAVENOUS | Status: DC | PRN
  Filled 2019-08-21: qty 5

## 2019-08-21 MED ORDER — WITCH HAZEL-GLYCERIN EX PADS: 1.0000 "application " | MEDICATED_PAD | CUTANEOUS | Status: DC | PRN

## 2019-08-21 MED ORDER — FENTANYL CITRATE (PF) 100 MCG/2ML IJ SOLN: 25.0000 ug | INTRAMUSCULAR | Status: DC | PRN

## 2019-08-21 MED ORDER — DIPHENHYDRAMINE HCL 25 MG PO CAPS: 25.0000 mg | ORAL_CAPSULE | ORAL | Status: DC | PRN

## 2019-08-21 MED ORDER — MORPHINE SULFATE (PF) 0.5 MG/ML IJ SOLN
INTRAMUSCULAR | Status: AC
  Filled 2019-08-21: qty 10

## 2019-08-21 MED ORDER — SCOPOLAMINE 1 MG/3DAYS TD PT72
1.0000 | MEDICATED_PATCH | Freq: Once | TRANSDERMAL | Status: DC
  Administered 2019-08-21: 1.5 mg via TRANSDERMAL
  Filled 2019-08-21: qty 1

## 2019-08-21 MED ORDER — DIPHENHYDRAMINE HCL 50 MG/ML IJ SOLN
12.5000 mg | INTRAMUSCULAR | Status: DC | PRN
  Administered 2019-08-21: 12.5 mg via INTRAVENOUS
  Filled 2019-08-21: qty 1

## 2019-08-21 MED ORDER — SOD CITRATE-CITRIC ACID 500-334 MG/5ML PO SOLN
30.0000 mL | ORAL | Status: AC
  Administered 2019-08-21: 30 mL via ORAL

## 2019-08-21 SURGICAL SUPPLY — 35 items
BARRIER ADHS 3X4 INTERCEED (GAUZE/BANDAGES/DRESSINGS) ×2 IMPLANT
BENZOIN TINCTURE PRP APPL 2/3 (GAUZE/BANDAGES/DRESSINGS) ×2 IMPLANT
CHLORAPREP W/TINT 26ML (MISCELLANEOUS) ×2 IMPLANT
CLAMP CORD UMBIL (MISCELLANEOUS) IMPLANT
CLOSURE STERI STRIP 1/2 X4 (GAUZE/BANDAGES/DRESSINGS) ×2 IMPLANT
CLOTH BEACON ORANGE TIMEOUT ST (SAFETY) ×2 IMPLANT
DRSG OPSITE POSTOP 4X10 (GAUZE/BANDAGES/DRESSINGS) ×2 IMPLANT
ELECT REM PT RETURN 9FT ADLT (ELECTROSURGICAL) ×2
ELECTRODE REM PT RTRN 9FT ADLT (ELECTROSURGICAL) ×1 IMPLANT
EXTRACTOR VACUUM KIWI (MISCELLANEOUS) IMPLANT
GLOVE BIOGEL M 7.0 STRL (GLOVE) ×4 IMPLANT
GLOVE BIOGEL PI IND STRL 7.0 (GLOVE) ×3 IMPLANT
GLOVE BIOGEL PI INDICATOR 7.0 (GLOVE) ×3
GOWN STRL REUS W/TWL LRG LVL3 (GOWN DISPOSABLE) ×6 IMPLANT
KIT ABG SYR 3ML LUER SLIP (SYRINGE) IMPLANT
NEEDLE HYPO 25X5/8 SAFETYGLIDE (NEEDLE) IMPLANT
NS IRRIG 1000ML POUR BTL (IV SOLUTION) ×2 IMPLANT
PACK C SECTION WH (CUSTOM PROCEDURE TRAY) ×2 IMPLANT
PAD OB MATERNITY 4.3X12.25 (PERSONAL CARE ITEMS) ×2 IMPLANT
PENCIL SMOKE EVAC W/HOLSTER (ELECTROSURGICAL) ×2 IMPLANT
RTRCTR C-SECT PINK 25CM LRG (MISCELLANEOUS) IMPLANT
STRIP CLOSURE SKIN 1/2X4 (GAUZE/BANDAGES/DRESSINGS) ×2 IMPLANT
SUT PDS AB 0 CT1 27 (SUTURE) ×4 IMPLANT
SUT PLAIN 0 NONE (SUTURE) IMPLANT
SUT VIC AB 0 CTX 36 (SUTURE) ×3
SUT VIC AB 0 CTX36XBRD ANBCTRL (SUTURE) ×3 IMPLANT
SUT VIC AB 2-0 CT1 (SUTURE) ×4 IMPLANT
SUT VIC AB 2-0 CT1 27 (SUTURE) ×1
SUT VIC AB 2-0 CT1 TAPERPNT 27 (SUTURE) ×1 IMPLANT
SUT VIC AB 3-0 SH 27 (SUTURE)
SUT VIC AB 3-0 SH 27X BRD (SUTURE) IMPLANT
SUT VIC AB 4-0 KS 27 (SUTURE) ×2 IMPLANT
TOWEL OR 17X24 6PK STRL BLUE (TOWEL DISPOSABLE) ×2 IMPLANT
TRAY FOLEY W/BAG SLVR 14FR LF (SET/KITS/TRAYS/PACK) ×2 IMPLANT
WATER STERILE IRR 1000ML POUR (IV SOLUTION) ×2 IMPLANT

## 2019-08-21 NOTE — Progress Notes (Signed)
Labor Progress Note  Mikayla Castillo is a 29 y.o. female G2P0010 [redacted]w[redacted]d presenting for IOL for chronic HTN, controlled on labetalol during pregnancy 100mg  daily.   Subjective: Pt resting well, no support in room, husband does not do well in medical [employer]ro[employer], pt requesting to possible have her mother as support person in stead of husband. Pt still comfortable with epidural. Foley been in for 12 hours, plan to remove, pt consented for pitocin to start again.  Patient Active Problem List   Diagnosis Date Noted  . Maternal chronic hypertension, third trimester 08/20/2019   Objective: BP 106/63   Pulse 76   Temp 98.6 F (37 C) (Oral)   Resp 18   Ht 5\' 7"  (1.702 m)   Wt 108 kg   LMP 11/22/2018   SpO2 99%   BMI 37.31 kg/m  I/O last 3 completed shifts: In: -  Out: 800 [Urine:800] Total I/O In: -  Out: 1200 [Urine:1200] NST: FHR baseline 145 bpm, Variability: moderate, Accelerations:present, Decelerations: Absent. = Cat 2/Reactive CTX: occ Uterus gravid, soft non tender, moderate to palpate with contractions.  SVE:  Dilation: 2 Effacement (%): 70 Station: -2 Exam by:: [facility], CNM Pitocin at (Restarted pitocin now at 1, Was up to 8 and turned off at 1800 on 2/26 due to decels) mUn/min  [facility] removed.   Assessment:  Mikayla Castillo is a 29 y.o. female G2P0010 [redacted]w[redacted]d presenting for IOL for chronic HTN, controlled on labetalol during pregnancy 100mg  daily. Pt comfortable with epidural placement, not progressing in early labor with foley bulb removed at 12 hours. Plan to restart pitocin now.  Patient Active Problem List   Diagnosis Date Noted  . Maternal chronic hypertension, third trimester 08/20/2019   NICHD: Category 1  Membranes:  Intact, no s/s of infection  Induction:    Cytotec x2 @ 0150, 0736 on 2/26  Foley Bulb: inserted @ 2000 on 2/26  Pitocin - Restarted now at 1, Was up to 8 and turned off at 1800 on 2/26 due  to decels  Pain management:      IV pain management: x 2 @ 1219 & 1948 on 2/26             Epidural placement: @ 2130 on 2/26  GBS Negative  HA: Tylenol given @ 1219 & 1948, HA resolved  CHTN: mild HA, denies RUQ pain or vision changes, BP 106/63, no meds ordered.   Plan: Continue labor plan Continuous monitoring Rest Ambulate HA: Tylenol, and monitor for resolution.  CHTN: NO meds order BP normotensive, monitor, if elevated BP start labetalol.  Frequent position changes to facilitate fetal rotation and descent. Will reassess with cervical exam 1300 Anticipate labor progression and vaginal delivery.  Proceed with PCS for fetal intolerance remote from delivery if another prolonged decel occurs.   Md [place] aware of plan and verbalized agreement.   3/26, NP-C, CNM, MSN 08/21/2019. 9:54 AM

## 2019-08-21 NOTE — Progress Notes (Signed)
Mikayla Castillo is a 29 y.o. G2P0010 at [redacted]w[redacted]d by LMP admitted for induction of labor due to Hypertension.  Subjective: Called to see patient due to prolonged deceleration to the 70's for 4-5 minutes. Resolved with position change. Pt is comfortable with epidural. She has had 4  intermittent recurrent prolonged decelerations  Since yesterday evening.   Objective: BP 129/84   Pulse (!) 113   Temp (!) 97.5 F (36.4 C) (Oral)   Resp 18   Ht 5\' 7"  (1.702 m)   Wt 108 kg   LMP 11/22/2018   SpO2 99%   BMI 37.31 kg/m  I/O last 3 completed shifts: In: -  Out: 800 [Urine:800] Total I/O In: 4633.7 [I.V.:4633.7] Out: 2100 [Urine:2100]  FHT:  FHR: 150 bpm, variability: moderate,  accelerations:  Present,  decelerations:  Present lates and prolonged deceleration to 70's for 4 minutes  UC:   irregular SVE:   Dilation: 2 Effacement (%): 70 Station: -2 Exam by:: [email or web address], CNM  Labs: Lab Results  Component Value Date   WBC 6.8 08/20/2019   HGB 12.7 08/20/2019   HCT 39.9 08/20/2019   MCV 78.1 (L) 08/20/2019   PLT 233 08/20/2019    Assessment / Plan:  Nonreassuring fetal heart tracing. Given that she has had recurrent prolonged decelerations with slow return to baseline recommend cesarean section. R/b/a discussed with the patient including but not limited to infection , bleeding , damage to bowel bladder and baby with the need for further surgery. R/o transfusion discussed. Pt voiced understanding and desires to proceed.  08/22/2019 08/21/2019, 12:10 PM

## 2019-08-21 NOTE — Progress Notes (Signed)
Labor Progress Note  Mikayla Castillo is a 29 y.o. female G2P0010 [redacted]w[redacted]d presenting for IOL for chronic HTN, controlled on labetalol during pregnancy 100mg  daily.   Subjective: I was called to the room for prolonged decel to nadir of 60, resolved with hands and knees and bolus. DR called and consulted. Pt now comfortable with epidural which was placed 4 hours ago and had cat 2 as well with noted decels, then pt was placed on her  Back for foley cath  placement and had deep variables again. Talked with pt about R/B/A and safety and if a prolonged decel occurs again then I would recommend a PCS for fetal intolerance remote form delivery. Pt verbalized consent if needed. Pt endorses just wanting the baby to be safe.  Patient Active Problem List   Diagnosis Date Noted  . Maternal chronic hypertension, third trimester 08/20/2019   Objective: BP 120/69   Pulse (!) 101   Temp 98.5 F (36.9 C) (Axillary)   Resp 18   Ht 5\' 7"  (1.702 m)   Wt 108 kg   LMP 11/22/2018   SpO2 99%   BMI 37.31 kg/m  No intake/output data recorded. No intake/output data recorded. NST: FHR baseline 150 bpm, Variability: moderate, Accelerations:present, Decelerations:  Variables noted and x1 prolonged decels to nadir of 60s with return to baseline after 6 mins with position change and fluid bolus x1 @ 0056. = Cat 2/Reactive CTX:  irregular, every 4-10 minutes Uterus gravid, soft non tender, moderate to palpate with contractions.  SVE:  Dilation: 2 Effacement (%): 60 Station: -2 Exam by:: J. Sana Tessmer, CNM Pitocin at (Now=0, Was up to 8 and turned off at 1800 on 2/26 due to decels) mUn/min  [facility] foley double bubbled still in place with no cervical change  Assessment:  Mikayla Castillo is a 29 y.o. female G2P0010 [redacted]w[redacted]d presenting for IOL for chronic HTN, controlled on labetalol during pregnancy 100mg  daily. Pt comfortable with epidural placement, not progressing in early labor with foley in place no  cervical change with Cat2 for prolonged decel noted, but return to baseline and cat 1. Dr 26 consulted, agree if another prolonged decel occurs will plan for PCS.  Patient Active Problem List   Diagnosis Date Noted  . Maternal chronic hypertension, third trimester 08/20/2019   NICHD: Category 2  Category 2 with active intrauterine resuscitative measures fluid bolus and  position change. x1 prolonged x6 mins nadir of 60s @ 0052, resolved to  cat1.   Membranes:  Intact, no s/s of infection  Induction:    Cytotec x2 @ 0150, 0736 on 2/26  Foley Bulb: inserted @ 2000 on 2/26  Pitocin - Now=0, Was up to 8 and turned off at 1800 on 2/26 due to decels  Pain management:               IV pain management: x 2 @ 1219 & 1948 on 2/26             Epidural placement: @ 2130 on 2/26  GBS Negative  HA: Tylenol given @ 1219 & 1948, HA resolved  CHTN: mild HA, denies RUQ pain or vision changes, BP 120/69, no meds ordered.    Plan: Continue labor plan Continuous monitoring Rest Ambulate HA: Tylenol, and monitor for resolution.  CHTN: NO meds order BP normotensive, monitor, if elevated BP start labetalol.  Frequent position changes to facilitate fetal rotation and descent. Will reassess with cervical exam at when necessary or indicated, pt request  less checks due to painful checks.  Anticipate foley bulb falling out in next 8 hours Allow fetus to recover from decels ANticipate pitocin or AROM after foley bulb falls out Anticipate labor progression and vaginal delivery.  Proceed with PCS for fetal intolerance remote from delivery if another prolonged decel occurs.   Md [place] aware of plan and verbalized agreement.   Noralyn Pick, NP-C, CNM, MSN 08/21/2019. 1:20 AM

## 2019-08-21 NOTE — Progress Notes (Signed)
Labor Progress Note  Mikayla Castillo is a 29 y.o. female G2P0010 [redacted]w[redacted]d presenting for IOL for chronic HTN, controlled on labetalol during pregnancy 100mg  daily.   Subjective: Called to room for decled noted to nadir of 70s, several lates noted prior to decel, which occurred while pt was being turned, pt aware a PCS is recommended, pt verbalized denial of questions, waiting for her mother to come to be a support person. Pitocin turned off and fetus returned to baseline, Dr called and en route for PCS.  Patient Active Problem List   Diagnosis Date Noted  . Maternal chronic hypertension, third trimester 08/20/2019   Objective: BP 129/84   Pulse (!) 113   Temp (!) 97.5 F (36.4 C) (Oral)   Resp 18   Ht 5\' 7"  (1.702 m)   Wt 108 kg   LMP 11/22/2018   SpO2 99%   BMI 37.31 kg/m  I/O last 3 completed shifts: In: -  Out: 800 [Urine:800] Total I/O In: 4633.7 [I.V.:4633.7] Out: 2100 [Urine:2100] NST: FHR baseline 145 bpm, Variability: moderate, Accelerations:present, Decelerations: Lates with variables noted to nadri of 70s.  = Cat 2/Reactive CTX: occ Uterus gravid, soft non tender, moderate to palpate with contractions.  SVE:  Dilation: 2 Effacement (%): 70 Station: -2 Exam by:: [facility], CNM Pitocin at (Now at [phone], but turned off due to decels , Was up to 8 and turned off at 1800 on 2/26 due to decels) mUn/min  Assessment:  Mikayla Castillo is a 29 y.o. female G2P0010 [redacted]w[redacted]d presenting for IOL for chronic HTN, controlled on labetalol during pregnancy 100mg  daily. Pt comfortable with epidural placement, not progressing in early labor with foley bulb removed at 12 hours. Restarted pitocin, but had to stop it due to decel noted when pit was on 8units, pt aware PCS recommended due to fetal intolerance remote form delivery.  Patient Active Problem List   Diagnosis Date Noted  . Maternal chronic hypertension, third trimester 08/20/2019   NICHD: Category 2  Lates and  variables noted, pit off, position change resolved, back to baseline   Membranes:  Intact, no s/s of infection  Induction:    Cytotec x2 @ 0150, 0736 on 2/26  Foley Bulb: inserted @ 2000 on 2/26  Pitocin - Was @ 4unit then stopped now due to decles , Was up to 8 and  turned off at 1800 on 2/26 due to decels  Pain management:               IV pain management: x 2 @ 1219 & 1948 on 2/26             Epidural placement: @ 2130 on 2/26  GBS Negative  HA: Tylenol given @ 1219 & 1948, HA resolved  CHTN: mild HA, denies RUQ pain or vision changes, BP 129/86, no meds ordered.   Plan: Continue labor plan Continuous monitoring Rest Stop pitocin Hang Clinda and gent.  Pre-op orders placed fr PCS HA: Tylenol, and monitor for resolution.  CHTN: NO meds order BP normotensive, monitor, if elevated BP start labetalol.   Proceed with PCS for fetal intolerance remote from delivery now. Md [place] aware of plan and verbalized agreement.   3/26, NP-C, CNM, MSN 08/21/2019. 12:04 PM

## 2019-08-21 NOTE — Op Note (Signed)
Cesarean Section Procedure Note  Indications: non-reassuring fetal status  Pre-operative Diagnosis: 38 week 1 day pregnancy.  Post-operative Diagnosis: same  Surgeon: Gerald Leitz M.D.  Assistants: None  Anesthesia: Epidural anesthesia  ASA Class: 2   Procedure Details   The patient was seen in the Holding Room. The risks, benefits, complications, treatment options, and expected outcomes were discussed with the patient.  The patient concurred with the proposed plan, giving informed consent.  The site of surgery properly noted/marked. The patient was taken to Operating Room C, identified as Mikayla Castillo and the procedure verified as C-Section Delivery. A Time Out was held and the above information confirmed.  After induction of anesthesia, the patient was draped and prepped in the usual sterile manner. A Pfannenstiel incision was made and carried down through the subcutaneous tissue to the fascia. Fascial incision was made and extended transversely. The fascia was separated from the underlying rectus tissue superiorly and inferiorly. The peritoneum was identified and entered. Peritoneal incision was extended longitudinally. The utero-vesical peritoneal reflection was incised transversely and the bladder flap was bluntly freed from the lower uterine segment. A low transverse uterine incision was made. Delivered from cephalic presentation was a Female with Apgar scores of 7 at one minute and 7 at five minutes. After the umbilical cord was clamped and cut cord blood was obtained for evaluation. The placenta was removed intact and appeared normal. The uterine outline, tubes and ovaries appeared normal. The uterine incision was closed with running locked sutures of O vicryl. A second layer of 0 vicryl was used to imbricate the incision .Marland Kitchen Hemostasis was observed. Lavage was carried out until clear. Interseed was placed along the uterine incision. The peritoneum was reapproximated with 2-0 vicryl   The fascia was then reapproximated with running sutures of 0 pds. . The skin was reapproximated with 4-0 vicryl.  Instrument, sponge, and needle counts were correct prior the abdominal closure and at the conclusion of the case.   Findings: Female infant in cephalic presentation. Normal fallopian tubes and ovaries   Estimated Blood Loss:  700 ML         Drains: None         Total IV Fluids:   Per anesthesia ml         Specimens: Placenta sent to labor and delivery           Implants: None         Complications:  None; patient tolerated the procedure well.         Disposition: PACU - hemodynamically stable.         Condition: stable  Attending Attestation: I performed the procedure.

## 2019-08-21 NOTE — Transfer of Care (Signed)
Immediate Anesthesia Transfer of Care Note  Patient: Mikayla Castillo  Procedure(s) Performed: CESAREAN SECTION (N/A )  Patient Location: PACU  Anesthesia Type:Epidural  Level of Consciousness: awake, alert  and oriented  Airway & Oxygen Therapy: Patient Spontanous Breathing  Post-op Assessment: Report given to RN and Post -op Vital signs reviewed and stable  Post vital signs: Reviewed and stable  Last Vitals:  Vitals Value Taken Time  BP 94/56 08/21/19 1353  Temp 36.9 C 08/21/19 1354  Pulse 94 08/21/19 1354  Resp 16 08/21/19 1354  SpO2 100 % 08/21/19 1354  Vitals shown include unvalidated device data.  Last Pain:  Vitals:   08/21/19 1354  TempSrc: Oral  PainSc:          Complications: No apparent anesthesia complications

## 2019-08-22 ENCOUNTER — Encounter: Payer: Self-pay | Admitting: *Deleted

## 2019-08-22 DIAGNOSIS — Z98891 History of uterine scar from previous surgery: Secondary | ICD-10-CM

## 2019-08-22 LAB — CBC
HCT: 35 % — ABNORMAL LOW (ref 36.0–46.0)
Hemoglobin: 11 g/dL — ABNORMAL LOW (ref 12.0–15.0)
MCH: 24.6 pg — ABNORMAL LOW (ref 26.0–34.0)
MCHC: 31.4 g/dL (ref 30.0–36.0)
MCV: 78.3 fL — ABNORMAL LOW (ref 80.0–100.0)
Platelets: 222 10*3/uL (ref 150–400)
RBC: 4.47 MIL/uL (ref 3.87–5.11)
RDW: 13.1 % (ref 11.5–15.5)
WBC: 17.1 10*3/uL — ABNORMAL HIGH (ref 4.0–10.5)
nRBC: 0 % (ref 0.0–0.2)

## 2019-08-22 MED ORDER — PHENYLEPHRINE 40 MCG/ML (10ML) SYRINGE FOR IV PUSH (FOR BLOOD PRESSURE SUPPORT)
PREFILLED_SYRINGE | INTRAVENOUS | Status: AC
  Filled 2019-08-22: qty 10

## 2019-08-22 MED ORDER — ROCURONIUM BROMIDE 10 MG/ML (PF) SYRINGE
PREFILLED_SYRINGE | INTRAVENOUS | Status: AC
  Filled 2019-08-22: qty 10

## 2019-08-22 MED ORDER — PROPOFOL 10 MG/ML IV BOLUS
INTRAVENOUS | Status: AC
  Filled 2019-08-22: qty 20

## 2019-08-22 MED ORDER — ONDANSETRON HCL 4 MG/2ML IJ SOLN
INTRAMUSCULAR | Status: AC
  Filled 2019-08-22: qty 2

## 2019-08-22 MED ORDER — METHYLERGONOVINE MALEATE 0.2 MG/ML IJ SOLN
INTRAMUSCULAR | Status: AC
  Filled 2019-08-22: qty 1

## 2019-08-22 MED ORDER — DEXAMETHASONE SODIUM PHOSPHATE 10 MG/ML IJ SOLN
INTRAMUSCULAR | Status: AC
  Filled 2019-08-22: qty 1

## 2019-08-22 MED ORDER — MORPHINE SULFATE (PF) 0.5 MG/ML IJ SOLN
INTRAMUSCULAR | Status: AC
  Filled 2019-08-22: qty 10

## 2019-08-22 MED ORDER — FENTANYL CITRATE (PF) 250 MCG/5ML IJ SOLN
INTRAMUSCULAR | Status: AC
  Filled 2019-08-22: qty 5

## 2019-08-22 MED ORDER — SUCCINYLCHOLINE CHLORIDE 200 MG/10ML IV SOSY
PREFILLED_SYRINGE | INTRAVENOUS | Status: AC
  Filled 2019-08-22: qty 10

## 2019-08-22 MED ORDER — OXYTOCIN 40 UNITS IN NORMAL SALINE INFUSION - SIMPLE MED
INTRAVENOUS | Status: AC
  Filled 2019-08-22: qty 1000

## 2019-08-22 MED ORDER — MIDAZOLAM HCL 2 MG/2ML IJ SOLN
INTRAMUSCULAR | Status: AC
  Filled 2019-08-22: qty 2

## 2019-08-22 MED ORDER — PHENYLEPHRINE 40 MCG/ML (10ML) SYRINGE FOR IV PUSH (FOR BLOOD PRESSURE SUPPORT)
PREFILLED_SYRINGE | INTRAVENOUS | Status: AC
  Filled 2019-08-22: qty 20

## 2019-08-22 MED ORDER — CEFAZOLIN SODIUM-DEXTROSE 2-4 GM/100ML-% IV SOLN
INTRAVENOUS | Status: AC
  Filled 2019-08-22: qty 100

## 2019-08-22 NOTE — Lactation Note (Signed)
This note was copied from a baby's chart. Lactation Consultation Note  Patient Name: Mikayla Castillo Date: 08/22/2019   Entered room.  Mat. Grandmother with baby.  Infant asleep in bassinet, mom in shower.    LC told grandmother to have mom call out for lactation if she had questions or concerns about feeding her baby.         Maternal Data    Feeding Feeding Type: Bottle Fed - Formula Nipple Type: Slow - flow  LATCH Score                   Interventions    Lactation Tools Discussed/Used     Consult Status      [name] [facility] 08/22/2019, 5:01 PM

## 2019-08-22 NOTE — Lactation Note (Signed)
This note was copied from a baby's chart. Lactation Consultation Note  Patient Name: Mikayla Castillo [id number] Date: 08/22/2019 Reason for consult: Follow-up assessment;Early term 37-38.6wks    Patient requested LC.  Infant cueing in room.  Upon examination, moms breast tissue had swelling and edema noted around nipple/areola.  Mom has not been wearing shells previously provided for her.  LC encourages mom to wear these as they will help the infant to latch easier.  Reverse pressure demonstrated.  Hand exp. Reviewed.  Drops seen after a minutes of massage.  Mom had just taken a warm shower.  1 ml collected and fed to infant with gloved finger.  After multiple attempts, infant did latch with wide flanged lips. Swallows heard with compression and massage.    Mom denies pain.  Infant still feeding after 10 minutes when LC left room.   Mom denies questions at this time.  She will call out if further assistance needed.     Maternal Data Has patient been taught Hand Expression?: Yes  Feeding Feeding Type: Bottle Fed - Formula Nipple Type: Slow - flow  LATCH Score Latch: Repeated attempts needed to sustain latch, nipple held in mouth throughout feeding, stimulation needed to elicit sucking reflex.  Audible Swallowing: A few with stimulation  Type of Nipple: Everted at rest and after stimulation  Comfort (Breast/Nipple): Soft / non-tender  Hold (Positioning): Assistance needed to correctly position infant at breast and maintain latch.  LATCH Score: 7  Interventions Interventions: Breast feeding basics reviewed;Assisted with latch;Skin to skin;Breast massage;Hand express;Expressed milk;Position options;Support pillows;Adjust position;Shells;Breast compression  Lactation Tools Discussed/Used     Consult Status Consult Status: Follow-up Date: 08/23/19 Follow-up type: In-patient    [name] [facility] 08/22/2019, 6:06 PM

## 2019-08-22 NOTE — Progress Notes (Addendum)
Mikayla Castillo [id number] Postpartum Postoperative Day #1 Charm Barges, G2P1011, [redacted]w[redacted]d, S/P LT Cesarean Section due to fetal intolerance to labor.    Subjective: Patient up ad lib, denies syncope or dizziness. Reports consuming regular diet without issues and denies N/V. Patient reports no bowel movement + passing flatus.  Denies issues with urination and reports bleeding is appropriate.  Patient is breast and bottle feeding and reports going well. Undecided for postpartum contraception.  Pain is being appropriately managed with use of Toradol and Oxy IR. Denies HA, RUQ pain or vision changes.    Objective: Patient Vitals for the past 24 hrs:  BP Temp Temp src Pulse Resp SpO2  08/22/19 0615 126/76 98 F (36.7 C) Oral 70 18 97 %  08/22/19 0125 116/64 97.8 F (36.6 C) Oral 63 18 98 %  08/21/19 2238 (!) 99/55 -- -- 82 -- --  08/21/19 2111 115/61 98.6 F (37 C) Oral 69 18 99 %  08/21/19 1715 120/72 98.7 F (37.1 C) Axillary -- 17 99 %  08/21/19 1542 127/75 98.8 F (37.1 C) Axillary -- 16 --  08/21/19 1530 -- -- -- -- 15 --  08/21/19 1525 -- -- -- -- 19 --  08/21/19 1520 -- -- -- -- 17 --  08/21/19 1515 116/73 -- -- -- (!) 22 --  08/21/19 1510 -- -- -- -- 15 --  08/21/19 1505 -- -- -- -- (!) 22 --  08/21/19 1500 105/75 98.7 F (37.1 C) Oral -- 16 --  08/21/19 1455 -- -- -- -- 19 --  08/21/19 1450 -- -- -- -- 17 --  08/21/19 1446 -- -- -- -- -- 99 %  08/21/19 1445 109/64 -- -- 80 (!) 21 100 %  08/21/19 1440 -- -- -- 76 19 100 %  08/21/19 1435 -- -- -- 81 16 100 %  08/21/19 1430 109/60 98.5 F (36.9 C) Axillary 86 17 (!) 81 %  08/21/19 1425 -- -- -- 84 17 99 %  08/21/19 1420 -- -- -- 87 18 100 %  08/21/19 1415 (!) 114/51 -- -- 94 17 100 %  08/21/19 1410 -- -- -- 96 (!) 21 99 %  08/21/19 1405 -- -- -- 96 (!) 22 100 %  08/21/19 1400 (!) 109/58 -- -- 95 (!) 21 100 %  08/21/19 1358 -- -- -- 91 (!) 23 100 %  08/21/19 1357 -- -- -- 93 16 100 %  08/21/19 1355 -- -- -- 94 16 100  %  08/21/19 1354 (!) 94/56 98.4 F (36.9 C) Axillary 91 16 100 %  08/21/19 1230 (!) 127/96 -- -- (!) 108 18 --  08/21/19 1200 129/89 -- -- 97 18 --     Physical Exam:  General: alert Mood/Affect: Happy with birth and baby Lungs: clear to auscultation, no wheezes, rales or rhonchi, symmetric air entry.  Heart: normal rate, regular rhythm, normal S1, S2, no murmurs, rubs, clicks or gallops. Breast: breasts appear normal, no suspicious masses, no skin or nipple changes or axillary nodes. Abdomen:  + bowel sounds, soft, non-tender Incision: healing well, Honeycomb dressing  Uterine Fundus: firm, involution -1 Lochia: appropriate Skin: Warm, Dry. DVT Evaluation: No evidence of DVT seen on physical exam. No clonus, 2 plus DTR patellar.  1  plus bilateral lower extremities edema.   Labs: Recent Labs    08/20/19 1759 08/21/19 1459 08/22/19 0603  HGB 12.7 12.6 11.0*  HCT 39.9 39.3 35.0*  WBC 6.8 10.7* 17.1*    CBG (last 3)  No results for input(s): GLUCAP in the last 72 hours.   I/O: I/O last 3 completed shifts: In: 9455.9 [P.O.:1580; I.V.:7765.9; IV Piggyback:110] Out: 4980 [Urine:4250; Blood:730]   Assessment Postpartum Postoperative Day # 1 Mikayla Castillo, G2P1011, [redacted]w[redacted]d, S/P LT Cesarean Section due to fetal intolerance to labor.  Pt stable. Appropriate Involution and bleeding. Breast and bottle feeding and pt reports going well. Hemodynamically Stable. Pt has been normotensive throughout hospital stay and is not on BP meds. Will assess need for meds if BP increases.  Plan: Continue other mgmt as ordered CHNT:will re start labetalol 100mg  BID if BP >140/90s VTE Prophylactics: SCD, ambulated as tolerates.  Pain control: Motrin/Tylenol/Narcotics PRN Plan for discharge tomorrow and Lactation consult  Dr. to be updated on patient status  Richardson Dopp, CNM 08/22/2019, 11:54 AM   Patient seen and examined . Agree with management as outlined above.

## 2019-08-22 NOTE — Lactation Note (Signed)
This note was copied from a baby's chart. Lactation Consultation Note  Patient Name: Girl Bette Brienza [id number] Date: 08/22/2019 Reason for consult: Initial assessment;1st time breastfeeding;Early term 37-38.6wks P1, 1 hour female, ETI infant.  Infant had 3 stools and 3 voids, LC changed a void diaper in room. Mom with hx: HTN on HCTZ, Labetalol, Migraines (Fioricet), GERD ( Protonix) mom with C/S delivery. LC noticed pitting edema in breast tissue and nipples are flat, flatness in nipples is probably due to the edema in breast tissue. Per mom, infant has not been sustaining latch breastfed maybe 1- 2 minutes so she has been offering infant formula.  Mom's feeding choice at admission was breast and formula feeding. LC discussed hand expression and mom taught back easily expression colostrum, infant was given 4 mls of EBM by spoon and then grand mother supplement infant with 3 mls of formula using a slow flow bottle nipple after infant was breastfed by mom.  LC had mom to pre-pump  breast with hand pump, mom latched infant on right breast using the football hold ,infant was on and off breast not sustaining latch for 15 minutes. Mom will continue to work toward latching infant at breast. Mom knows to call RN or LC to help assist with latching infant at breast. Mom was given breast shells to wear in bra during the day to help alleviate edema and extend nipple shaft out more to help with infant latching at breast. Mom will use DEBP every 3 hours for 15 minutes on initial setting to help establish milk supply due to infant not latch well currently at breast. Mom shown how to use DEBP & how to disassemble, clean, & reassemble parts. Mom will breastfeed infant according to hunger cues, 8 to 12 times within 24 hours on demand and not exceed 3 hours without breast feed infant. Mom's current feeding plan within first 24 hours: 1. Mom will latch infant at breast first, then supplement infant with EBM  and then formula if needed based on infant's age/ hours of life. 2. Mom was given supplemental sheet after breastfeeding for age/ hours of life.  3. Mom will pump every 3 hours for 15 minutes on initial setting.   Maternal Data Formula Feeding for Exclusion: Yes Reason for exclusion: Mother's choice to formula and breast feed on admission Has patient been taught Hand Expression?: Yes Does the patient have breastfeeding experience prior to this delivery?: No  Feeding Feeding Type: Breast Fed Nipple Type: Slow - flow  LATCH Score Latch: Repeated attempts needed to sustain latch, nipple held in mouth throughout feeding, stimulation needed to elicit sucking reflex.  Audible Swallowing: A few with stimulation  Type of Nipple: Flat(due to edema in breast tissue.)  Comfort (Breast/Nipple): Soft / non-tender  Hold (Positioning): Assistance needed to correctly position infant at breast and maintain latch.  LATCH Score: 6  Interventions Interventions: Breast feeding basics reviewed;Breast compression;Assisted with latch;Adjust position;Skin to skin;Support pillows;Breast massage;Position options;DEBP;Hand pump;Expressed milk;Hand express;Pre-pump if needed;Reverse pressure;Shells  Lactation Tools Discussed/Used Tools: Shells Shell Type: Other (comment)(mom with edema at breast.) WIC Program: No Pump Review: Setup, frequency, and cleaning;Milk Storage Initiated by:: Danelle Earthly, IBCLC Date initiated:: 08/22/19   Consult Status Consult Status: Follow-up Date: 08/22/19 Follow-up type: In-patient    Danelle Earthly 08/22/2019, 12:53 AM

## 2019-08-22 NOTE — Progress Notes (Signed)
MOB was referred for history of anxiety. * Referral screened out by Clinical Social Worker because none of the following criteria appear to apply: ~ History of anxiety/depression during this pregnancy, or of post-partum depression following prior delivery. After chart review CSW found not concerns with anxiety during prenatal visits.  ~ Diagnosis of anxiety and/or depression within last 3 years. Anxiety dx dates back to 2014. OR * MOB's symptoms currently being treated with medication and/or therapy. Please contact the Clinical Social Worker if needs arise, by MOB request, or if MOB scores greater than 9/yes to question 10 on Edinburgh Postpartum Depression Screen.  Kursten Kruk D. Vora Clover, MSW, LCSWA Clinical Social Worker [phone] 

## 2019-08-23 MED ORDER — ACETAMINOPHEN 500 MG PO TABS: 1000.0000 mg | ORAL_TABLET | Freq: Three times a day (TID) | ORAL | 0 refills | Status: AC | PRN

## 2019-08-23 MED ORDER — OXYCODONE HCL 5 MG PO TABS: 5.0000 mg | ORAL_TABLET | Freq: Four times a day (QID) | ORAL | 0 refills | Status: AC | PRN

## 2019-08-23 MED ORDER — FAMOTIDINE 20 MG PO TABS
20.0000 mg | ORAL_TABLET | Freq: Every day | ORAL | Status: DC
  Administered 2019-08-23: 20 mg via ORAL
  Filled 2019-08-23: qty 1

## 2019-08-23 NOTE — Discharge Summary (Signed)
OB Discharge Summary     Patient Name: Mikayla Castillo DOB: 04-Jul-1990 MRN: [id number]  Date of admission: 08/20/2019 Delivering MD: Gerald Leitz   Date of discharge: 08/23/2019  Admitting diagnosis: Maternal chronic hypertension, third trimester [O10.913] Intrauterine pregnancy: [redacted]w[redacted]d     Secondary diagnosis:  Active Problems:   Maternal chronic hypertension, third trimester   Status post primary low transverse cesarean section   Fetal intolerance to labor, delivered, current hospitalization  Additional problems: none     Discharge diagnosis: Term Pregnancy Delivered and CHTN                                                                                                Post partum procedures:none  Augmentation: AROM, Pitocin, Cytotec and Foley Balloon  Complications: None  Hospital course:  Induction of Labor With Cesarean Section  29 y.o. yo G2P1011 at [redacted]w[redacted]d was admitted to the hospital 08/20/2019 for induction of labor. Patient had a labor course significant for [facility]. The patient went for cesarean section due to Arrest of Dilation, remote from delivery and delivered a Viable infant,08/21/2019  Membrane Rupture Time/Date: 1:02 PM ,08/21/2019   Details of operation can be found in separate operative Note.  Patient had an uncomplicated postpartum course. She is ambulating, tolerating a regular diet, passing flatus, and urinating well.  Patient is discharged home in stable condition on 08/23/19.                                    Physical exam  Vitals:   08/22/19 1430 08/22/19 1454 08/22/19 2205 08/23/19 0554  BP: 119/71  (!) 102/56 119/72  Pulse: (!) 102  98 83  Resp: 20  18 18   Temp: (!) 101.5 F (38.6 C) 98.8 F (37.1 C) 98.3 F (36.8 C) 98 F (36.7 C)  TempSrc: Oral Oral Oral Oral  SpO2: 100%  100% 100%  Weight:      Height:       General: alert, cooperative and no distress Lochia: appropriate Uterine Fundus: firm Incision: Healing well with no significant  drainage DVT Evaluation: No evidence of DVT seen on physical exam. No cords or calf tenderness. Calf/Ankle edema is present Labs: Lab Results  Component Value Date   WBC 17.1 (H) 08/22/2019   HGB 11.0 (L) 08/22/2019   HCT 35.0 (L) 08/22/2019   MCV 78.3 (L) 08/22/2019   PLT 222 08/22/2019   CMP Latest Ref Rng & Units 08/20/2019  Glucose 70 - 99 mg/dL 08/22/2019)  BUN 6 - 20 mg/dL 831(D)  Creatinine <1(V - 1.00 mg/dL 6.16  Sodium 0.73 - 710 mmol/L 132(L)  Potassium 3.5 - 5.1 mmol/L 3.4(L)  Chloride 98 - 111 mmol/L 103  CO2 22 - 32 mmol/L 17(L)  Calcium 8.9 - 10.3 mg/dL 8.3(L)  Total Protein 6.5 - 8.1 g/dL 626)  Total Bilirubin 0.3 - 1.2 mg/dL 0.6  Alkaline Phos 38 - 126 U/L 114  AST 15 - 41 U/L 16  ALT 0 - 44 U/L 13    Discharge instruction:  per After Visit Summary and "Baby and Me Booklet".  After visit meds:  Allergies as of 08/23/2019      Reactions   Naproxen Other (See Comments)   Stomach pain-burning   Amoxicillin Rash   Did it involve swelling of the face/tongue/throat, SOB, or low BP? No Did it involve sudden or severe rash/hives, skin peeling, or any reaction on the inside of your mouth or nose? Yes Did you need to seek medical attention at a hospital or doctor's office? Yes When did it last happen?2019 If all above answers are "NO", may proceed with cephalosporin use.   Penicillins Rash   Did it involve swelling of the face/tongue/throat, SOB, or low BP? No Did it involve sudden or severe rash/hives, skin peeling, or any reaction on the inside of your mouth or nose? Yes Did you need to seek medical attention at a hospital or doctor's office? Yes When did it last happen?2019 If all above answers are "NO", may proceed with cephalosporin use.      Medication List    STOP taking these medications   aspirin EC 81 MG tablet   prenatal multivitamin Tabs tablet     TAKE these medications   acetaminophen 500 MG tablet Commonly known as: TYLENOL Take 2  tablets (1,000 mg total) by mouth every 8 (eight) hours as needed for moderate pain or headache. What changed:   when to take this  reasons to take this   calcium carbonate 500 MG chewable tablet Commonly known as: TUMS - dosed in mg elemental calcium Chew 2 tablets by mouth 5 (five) times daily as needed for indigestion or heartburn.   furosemide 20 MG tablet Commonly known as: LASIX Take 20 mg by mouth every other day.   labetalol 100 MG tablet Commonly known as: NORMODYNE Take 100 mg by mouth 2 (two) times daily.   oxyCODONE 5 MG immediate release tablet Commonly known as: Oxy IR/ROXICODONE Take 1 tablet (5 mg total) by mouth every 6 (six) hours as needed for up to 5 days for moderate pain or severe pain.       Diet: low salt diet  Activity: Advance as tolerated. Pelvic rest for 6 weeks.   Outpatient follow up:6 weeks Follow up Appt:No future appointments. Follow up Visit:No follow-ups on file.  Postpartum contraception: considering IUD  Newborn Data: Live born female  Birth Weight: 6 lb 8.4 oz (2960 g) APGAR: 7, 7  Newborn Delivery   Birth date/time: 08/21/2019 13:03:00 Delivery type: C-Section, Low Transverse Trial of labor: Yes C-section categorization: Primary      Baby Feeding: Bottle and Breast Disposition:home with mother   08/23/2019 Arrie Eastern, CNM

## 2019-08-23 NOTE — Progress Notes (Signed)
Pt c/o heartburn. [facility] CNM called and order received.

## 2019-08-23 NOTE — Lactation Note (Signed)
Lactation Consultation Note  Patient Name: Mikayla Castillo [id number] Date: 08/23/2019    [facility] student met w/ mom before being discharge.  Mom is currently putting baby to breast when not in pain and also giving formula.  Mom would like to do both breast and formula when she goes home. Mom mentioned she offers formula when she is unable to put baby to breast because of pain.  Mom has not noticed any changes in breast.    LC student informed mom to offer breast to baby when baby shows feeding cues and if baby is still hungry after to give formula.  If mom is in to much pain, she can give baby formula but make sure she pumps or hand expresses to help w/ stimulation of breast. [place] student reiterated the importance of stimulating the breast if she chooses to do both breast/formula.  [facility] student informed mom to take care of herself.  [facility] student reviewed following items for discharge; -input/output -stool color transition -supply & demand -engorgement: prevention/treatment -support groups/outpatient   Mom has an Evenflo breast pump at home. Mom is currently on furosemide, labetalol and oxycodone which are all safe to take while breastfeeding.           Mikayla Castillo. Mikayla Castillo 08/23/2019, 1:38 PM

## 2019-08-24 NOTE — Anesthesia Postprocedure Evaluation (Signed)
Anesthesia Post Note  Patient: Mikayla Castillo  Procedure(s) Performed: CESAREAN SECTION (N/A )     Patient location during evaluation: PACU Anesthesia Type: Epidural Level of consciousness: oriented and awake and alert Pain management: pain level controlled Vital Signs Assessment: post-procedure vital signs reviewed and stable Respiratory status: spontaneous breathing, respiratory function stable and patient connected to nasal cannula oxygen Cardiovascular status: blood pressure returned to baseline and stable Postop Assessment: no headache, no backache and no apparent nausea or vomiting Anesthetic complications: no    Last Vitals:  Vitals:   08/22/19 2205 08/23/19 0554  BP: (!) 102/56 119/72  Pulse: 98 83  Resp: 18 18  Temp: 36.8 C 36.7 C  SpO2: 100% 100%    Last Pain:  Vitals:   08/23/19 1510  TempSrc:   PainSc: 3                  Garyson Stelly S

## 2020-04-26 ENCOUNTER — Other Ambulatory Visit: Payer: BC Managed Care – PPO

## 2020-04-26 DIAGNOSIS — Z20822 Contact with and (suspected) exposure to covid-19: Secondary | ICD-10-CM

## 2020-04-27 LAB — NOVEL CORONAVIRUS, NAA: SARS-CoV-2, NAA: NOT DETECTED

## 2020-04-27 LAB — SARS-COV-2, NAA 2 DAY TAT

## 2020-04-28 ENCOUNTER — Telehealth: Payer: Self-pay | Admitting: General Practice

## 2020-04-28 NOTE — Telephone Encounter (Signed)
Patient returned missed phone call. Reassured her that her results are correct. She is COVID negative.

## 2021-01-23 ENCOUNTER — Other Ambulatory Visit: Payer: Self-pay | Admitting: Family Medicine

## 2021-01-23 DIAGNOSIS — N6002 Solitary cyst of left breast: Secondary | ICD-10-CM

## 2021-01-23 DIAGNOSIS — N6452 Nipple discharge: Secondary | ICD-10-CM

## 2021-01-26 ENCOUNTER — Ambulatory Visit
Admission: RE | Admit: 2021-01-26 | Discharge: 2021-01-26 | Disposition: A | Discharge status: Home / Self Care | Payer: BC Managed Care – PPO | Source: Ambulatory Visit | Attending: Family Medicine | Admitting: Family Medicine

## 2021-01-26 ENCOUNTER — Other Ambulatory Visit: Payer: Self-pay

## 2021-01-26 DIAGNOSIS — N6002 Solitary cyst of left breast: Secondary | ICD-10-CM

## 2021-01-26 DIAGNOSIS — N6452 Nipple discharge: Secondary | ICD-10-CM

## 2021-01-30 ENCOUNTER — Ambulatory Visit: Payer: Self-pay

## 2021-01-30 ENCOUNTER — Ambulatory Visit
Admission: EM | Admit: 2021-01-30 | Discharge: 2021-01-30 | Disposition: A | Discharge status: Home / Self Care | Payer: BC Managed Care – PPO | Attending: Internal Medicine | Admitting: Internal Medicine

## 2021-01-30 DIAGNOSIS — H5712 Ocular pain, left eye: Secondary | ICD-10-CM | POA: Diagnosis not present

## 2021-01-30 DIAGNOSIS — S0502XA Injury of conjunctiva and corneal abrasion without foreign body, left eye, initial encounter: Secondary | ICD-10-CM | POA: Diagnosis not present

## 2021-01-30 MED ORDER — ERYTHROMYCIN 5 MG/GM OP OINT: TOPICAL_OINTMENT | OPHTHALMIC | 0 refills | Status: AC

## 2021-01-30 NOTE — ED Triage Notes (Signed)
Pt states her baby scratched her lt eye last night in her sleep. C/o lt eye watery, pain, and red.

## 2021-01-30 NOTE — ED Provider Notes (Signed)
[facility]    CSN: [id number] Arrival date & time: 01/30/21  1550      History   Chief Complaint Chief Complaint  Patient presents with   Eye Injury    HPI Mikayla Castillo is a 30 y.o. female.   Patient presents with left eye injury and pain that started yesterday after baby accidentally scratched eye while she was sleeping.  Patient having watery discharge, pain, redness, inflammation to left eye.  Denies any blurry vision.   Eye Injury   Past Medical History:  Diagnosis Date   Hypertension     Patient Active Problem List   Diagnosis Date Noted   Status post primary low transverse cesarean section 08/22/2019   Fetal intolerance to labor, delivered, current hospitalization 08/22/2019   Maternal chronic hypertension, third trimester 08/20/2019    Past Surgical History:  Procedure Laterality Date   CESAREAN SECTION N/A 08/21/2019   Procedure: CESAREAN SECTION;  Surgeon: Gerald Leitz, MD;  Location: [facility] LD ORS;  Service: Obstetrics;  Laterality: N/A;   WISDOM TOOTH EXTRACTION      OB History     Gravida  2   Para  1   Term  1   Preterm      AB  1   Living  1      SAB      IAB  1   Ectopic      Multiple  0   Live Births  1            Home Medications    Prior to Admission medications   Medication Sig Start Date End Date Taking? Authorizing Provider  erythromycin ophthalmic ointment Place a 1 cm ribbon of ointment into the lower eyelid 4 times daily for 7 days. 01/30/21  Yes Lance Muss, FNP  acetaminophen (TYLENOL) 500 MG tablet Take 2 tablets (1,000 mg total) by mouth every 8 (eight) hours as needed for moderate pain or headache. 08/23/19   Roma Schanz, CNM  calcium carbonate (TUMS - DOSED IN MG ELEMENTAL CALCIUM) 500 MG chewable tablet Chew 2 tablets by mouth 5 (five) times daily as needed for indigestion or heartburn.    [provider]  furosemide (LASIX) 20 MG tablet Take 20 mg by mouth every other day.     [provider]  labetalol (NORMODYNE) 100 MG tablet Take 100 mg by mouth 2 (two) times daily.    [provider]    Family History Family History  Problem Relation Age of Onset   Hypertension Father     Social History Social History   Tobacco Use   Smoking status: Never   Smokeless tobacco: Never  Vaping Use   Vaping Use: Never used  Substance Use Topics   Alcohol use: Yes   Drug use: No     Allergies   Naproxen, Amoxicillin, and Penicillins   Review of Systems Review of Systems Per HPI  Physical Exam Triage Vital Signs ED Triage Vitals [01/30/21 1630]  Enc Vitals Group     BP 137/72     Pulse Rate 90     Resp 18     Temp 98.9 F (37.2 C)     Temp Source Oral     SpO2 99 %     Weight      Height      Head Circumference      Peak Flow      Pain Score 10     Pain Loc  Pain Edu?      Excl. in GC?    No data found.  Updated Vital Signs BP 137/72 (BP Location: Left Arm)   Pulse 90   Temp 98.9 F (37.2 C) (Oral)   Resp 18   LMP 01/09/2021   SpO2 99%   Breastfeeding No   Visual Acuity Right Eye Distance: 20/20 Left Eye Distance: 20/25 Bilateral Distance: 20/15  Right Eye Near:   Left Eye Near:    Bilateral Near:     Physical Exam Constitutional:      Appearance: Normal appearance.  HENT:     Head: Normocephalic and atraumatic.  Eyes:     General: Lids are everted, no foreign bodies appreciated.     Extraocular Movements: Extraocular movements intact.     Conjunctiva/sclera: Conjunctivae normal.     Pupils:     Left eye: Corneal abrasion and fluorescein uptake present.      Comments: Pinpoint left corneal abrasion located to cornea overlying left lower iris.  Mild swelling noted to upper and lower eyelids.  Pulmonary:     Effort: Pulmonary effort is normal.  Neurological:     General: No focal deficit present.     Mental Status: She is alert and oriented to person, place, and time. Mental status is at baseline.   Psychiatric:        Mood and Affect: Mood normal.        Behavior: Behavior normal.        Thought Content: Thought content normal.        Judgment: Judgment normal.     UC Treatments / Results  Labs (all labs ordered are listed, but only abnormal results are displayed) Labs Reviewed - No data to display  EKG   Radiology No results found.  Procedures Procedures (including critical care time)  Medications Ordered in UC Medications - No data to display  Initial Impression / Assessment and Plan / UC Course  I have reviewed the triage vital signs and the nursing notes.  Pertinent labs & imaging results that were available during my care of the patient were reviewed by me and considered in my medical decision making (see chart for details).     Will treat left corneal abrasion with erythromycin eye ointment.  Unable to prescribe Acular eyedrops to alleviate pain because patient has allergy to naproxen.  Visual acuity screening normal in urgent care today.  Patient was provided with contact information for ophthalmology if blurred vision occurs or if symptoms do not improve in the next 1 to 2 days.Discussed strict return precautions. Patient verbalized understanding and is agreeable with plan.  Final Clinical Impressions(s) / UC Diagnoses   Final diagnoses:  Abrasion of left cornea, initial encounter  Left eye pain     Discharge Instructions      You have been prescribed erythromycin eye ointment to prevent and treat infection associated with left corneal abrasion.  Please call provided contact information for eye doctor if blurry vision occurs or if symptoms do not improve in the next 1 to 2 days.     ED Prescriptions     Medication Sig Dispense Auth. Provider   erythromycin ophthalmic ointment Place a 1 cm ribbon of ointment into the lower eyelid 4 times daily for 7 days. 3.5 g Lance Muss, FNP      PDMP not reviewed this encounter.   Lance Muss,  FNP 01/30/21 1714

## 2021-01-30 NOTE — Discharge Instructions (Addendum)
You have been prescribed erythromycin eye ointment to prevent and treat infection associated with left corneal abrasion.  Please call provided contact information for eye doctor if blurry vision occurs or if symptoms do not improve in the next 1 to 2 days.

## 2021-05-20 ENCOUNTER — Other Ambulatory Visit: Payer: Self-pay

## 2021-05-20 ENCOUNTER — Emergency Department (HOSPITAL_BASED_OUTPATIENT_CLINIC_OR_DEPARTMENT_OTHER)
Admission: EM | Admit: 2021-05-20 | Discharge: 2021-05-20 | Disposition: A | Discharge status: Home / Self Care | Payer: BC Managed Care – PPO | Attending: Emergency Medicine | Admitting: Emergency Medicine

## 2021-05-20 ENCOUNTER — Emergency Department (HOSPITAL_BASED_OUTPATIENT_CLINIC_OR_DEPARTMENT_OTHER): Payer: BC Managed Care – PPO

## 2021-05-20 ENCOUNTER — Encounter (HOSPITAL_BASED_OUTPATIENT_CLINIC_OR_DEPARTMENT_OTHER): Payer: Self-pay | Admitting: Emergency Medicine

## 2021-05-20 DIAGNOSIS — Z28310 Unvaccinated for covid-19: Secondary | ICD-10-CM | POA: Diagnosis not present

## 2021-05-20 DIAGNOSIS — J101 Influenza due to other identified influenza virus with other respiratory manifestations: Secondary | ICD-10-CM | POA: Diagnosis not present

## 2021-05-20 DIAGNOSIS — I10 Essential (primary) hypertension: Secondary | ICD-10-CM | POA: Diagnosis not present

## 2021-05-20 DIAGNOSIS — R509 Fever, unspecified: Secondary | ICD-10-CM | POA: Diagnosis present

## 2021-05-20 DIAGNOSIS — Z79899 Other long term (current) drug therapy: Secondary | ICD-10-CM | POA: Insufficient documentation

## 2021-05-20 DIAGNOSIS — Z20822 Contact with and (suspected) exposure to covid-19: Secondary | ICD-10-CM | POA: Diagnosis not present

## 2021-05-20 LAB — URINALYSIS, ROUTINE W REFLEX MICROSCOPIC
Bilirubin Urine: NEGATIVE
Glucose, UA: NEGATIVE mg/dL
Hgb urine dipstick: NEGATIVE
Ketones, ur: NEGATIVE mg/dL
Leukocytes,Ua: NEGATIVE
Nitrite: NEGATIVE
Specific Gravity, Urine: 1.021 (ref 1.005–1.030)
pH: 6.5 (ref 5.0–8.0)

## 2021-05-20 LAB — RESP PANEL BY RT-PCR (FLU A&B, COVID) ARPGX2
Influenza A by PCR: POSITIVE — AB
Influenza B by PCR: NEGATIVE
SARS Coronavirus 2 by RT PCR: NEGATIVE

## 2021-05-20 LAB — PREGNANCY, URINE: Preg Test, Ur: NEGATIVE

## 2021-05-20 NOTE — ED Provider Notes (Signed)
[facility] DEPT Provider Note   CSN: [id number] Arrival date & time: 05/20/21  1039     History Chief Complaint  Patient presents with   Generalized Body Aches    Mikayla Castillo is a 30 y.o. female.  HPI  Patient with significant medical history including hypertension presents with URI-like symptoms.  Patient endorses symptoms started on Thursday, she has been having subjective fevers, chills, nasal congestion, nonproductive cough, shortness of breath while coughing, decreased appetite, no taste, fatigue, general body aches.  She denies  stomach pain, had 1 episode of nausea without vomiting, denies constipation, diarrhea, states she is still tolerating p.o. without difficulty.  She does note that she is having some lower back pain and states that she has been urinating more frequently denies hematuria, dysuria, vaginal discharge, vaginal bleeding, denies  pelvic pain.  She does note that she has been unable to feel her IUD strings had this replaced a year and a half ago.  She is not vaccine against flu or COVID, she works in a school unsure of sick contacts.  She denies  alleviating or aggravating factors.  Past Medical History:  Diagnosis Date   Hypertension     Patient Active Problem List   Diagnosis Date Noted   Status post primary low transverse cesarean section 08/22/2019   Fetal intolerance to labor, delivered, current hospitalization 08/22/2019   Maternal chronic hypertension, third trimester 08/20/2019    Past Surgical History:  Procedure Laterality Date   CESAREAN SECTION N/A 08/21/2019   Procedure: CESAREAN SECTION;  Surgeon: Christophe Louis, MD;  Location: [facility] LD ORS;  Service: Obstetrics;  Laterality: N/A;   WISDOM TOOTH EXTRACTION       OB History     Gravida  2   Para  1   Term  1   Preterm      AB  1   Living  1      SAB      IAB  1   Ectopic      Multiple  0   Live Births  1           Family History  Problem  Relation Age of Onset   Hypertension Father     Social History   Tobacco Use   Smoking status: Never   Smokeless tobacco: Never  Vaping Use   Vaping Use: Never used  Substance Use Topics   Alcohol use: Yes   Drug use: No    Home Medications Prior to Admission medications   Medication Sig Start Date End Date Taking? Authorizing Provider  acetaminophen (TYLENOL) 500 MG tablet Take 2 tablets (1,000 mg total) by mouth every 8 (eight) hours as needed for moderate pain or headache. 08/23/19   Arrie Eastern, CNM  calcium carbonate (TUMS - DOSED IN MG ELEMENTAL CALCIUM) 500 MG chewable tablet Chew 2 tablets by mouth 5 (five) times daily as needed for indigestion or heartburn.    [provider]  erythromycin ophthalmic ointment Place a 1 cm ribbon of ointment into the lower eyelid 4 times daily for 7 days. 01/30/21   Teodora Medici, FNP  furosemide (LASIX) 20 MG tablet Take 20 mg by mouth every other day.    [provider]  labetalol (NORMODYNE) 100 MG tablet Take 100 mg by mouth 2 (two) times daily.    [provider]    Allergies    Naproxen, Amoxicillin, and Penicillins  Review of Systems   Review of Systems  Constitutional:  Positive for chills and fever.  HENT:  Positive for congestion. Negative for sore throat.   Respiratory:  Positive for cough. Negative for shortness of breath.   Cardiovascular:  Negative for chest pain.  Gastrointestinal:  Negative for abdominal pain, nausea and vomiting.  Genitourinary:  Positive for frequency. Negative for dyspareunia, dysuria and enuresis.  Musculoskeletal:  Positive for myalgias. Negative for back pain.  Skin:  Negative for rash.  Neurological:  Negative for dizziness and headaches.  Hematological:  Does not bruise/bleed easily.   Physical Exam Updated Vital Signs BP 108/76   Pulse (!) 20   Temp 99.2 F (37.3 C) (Oral)   Resp 18   Ht 5\' 7"  (1.702 m)   Wt 93 kg   LMP  (LMP Unknown) Comment: has IUD   SpO2 99%   BMI 32.11 kg/m   Physical Exam Vitals and nursing note reviewed.  Constitutional:      General: She is not in acute distress.    Appearance: She is not ill-appearing.  HENT:     Head: Normocephalic and atraumatic.     Right Ear: Tympanic membrane, ear canal and external ear normal.     Left Ear: Tympanic membrane, ear canal and external ear normal.     Nose: Congestion present.     Comments: Bilateral erythematous turbinates.    Mouth/Throat:     Mouth: Mucous membranes are moist.     Pharynx: Oropharynx is clear. No oropharyngeal exudate or posterior oropharyngeal erythema.     Comments: Cobblestoning noted in the posterior pharynx. Eyes:     Conjunctiva/sclera: Conjunctivae normal.  Cardiovascular:     Rate and Rhythm: Normal rate and regular rhythm.     Pulses: Normal pulses.     Heart sounds: No murmur heard.   No friction rub. No gallop.  Pulmonary:     Effort: No respiratory distress.     Breath sounds: No wheezing, rhonchi or rales.  Abdominal:     Palpations: Abdomen is soft.     Tenderness: There is no abdominal tenderness. There is no right CVA tenderness or left CVA tenderness.  Musculoskeletal:     Right lower leg: No edema.     Left lower leg: No edema.  Skin:    General: Skin is warm and dry.  Neurological:     Mental Status: She is alert.  Psychiatric:        Mood and Affect: Mood normal.    ED Results / Procedures / Treatments   Labs (all labs ordered are listed, but only abnormal results are displayed) Labs Reviewed  RESP PANEL BY RT-PCR (FLU A&B, COVID) ARPGX2 - Abnormal; Notable for the following components:      Result Value   Influenza A by PCR POSITIVE (*)    All other components within normal limits  URINALYSIS, ROUTINE W REFLEX MICROSCOPIC - Abnormal; Notable for the following components:   Protein, ur TRACE (*)    All other components within normal limits  PREGNANCY, URINE    EKG None  Radiology DG Chest Portable 1  View  Result Date: 05/20/2021 CLINICAL DATA:  The cough and congestion.  Body aches. EXAM: PORTABLE CHEST 1 VIEW COMPARISON:  04/20/2013 FINDINGS: Normal heart, mediastinum and hila. Clear lungs.  No pleural effusion or pneumothorax. Skeletal structures are grossly intact. IMPRESSION: No active disease. Electronically Signed   By: Lajean Manes M.D.   On: 05/20/2021 13:17    Procedures Procedures   Medications Ordered in ED  Medications - No data to display  ED Course  I have reviewed the triage vital signs and the nursing notes.  Pertinent labs & imaging results that were available during my care of the patient were reviewed by me and considered in my medical decision making (see chart for details).    MDM Rules/Calculators/A&P                          Initial impression-presents with URI-like symptoms.  Alert, does not appear acute stress, vital signs are reassuring.  Likely viral infection, respiratory panel is pending at this time, she is coughing says it slightly productive we will obtain chest x-ray, also add on UA to rule out UTI abnormalities.  Work-up-respiratory panel positive for flu.  UA negative for nitrates, leukocytes and hematuria, urine pregnancy negative.  Chest x-ray unremarkable.  Rule out- Low suspicion for systemic infection as patient is nontoxic-appearing, vital signs reassuring, no obvious source infection noted on exam.  Low suspicion for pneumonia as lung sounds are clear bilaterally, x-ray did not reveal any acute findings.  I have low suspicion for PE as patient denies pleuritic chest pain, shortness of breath, patient is PERC. low suspicion for strep throat as oropharynx was visualized, no erythema or exudates noted.  Low suspicion patient would need  hospitalized due to viral infection or Covid as vital signs reassuring, patient is not in respiratory distress.  Low suspicion for UTI, Pilo, kidney stone that she has no CVA tenderness, UA negative for nitrates,  history of hematuria.  She does have some lower back pain but this like secondary due to influenza.   Plan-  Influenza A-recommend symptom management, follow with PCP for further evaluation.  Antiviral treatment will be deferred at this time as she is outside the treatment window, she is very low risk factors for adverse outcome.  Vital signs have remained stable, no indication for hospital admission.  Patient given at home care as well strict return precautions.  Patient verbalized that they understood agreed to said plan.  Final Clinical Impression(s) / ED Diagnoses Final diagnoses:  Influenza A    Rx / DC Orders ED Discharge Orders     None        Carroll Sage, PA-C 05/20/21 1446    Melene Plan, DO 05/20/21 1457

## 2021-05-20 NOTE — ED Notes (Signed)
Pt discharged to home. Discharge instructions have been discussed with patient and/or family members. Pt verbally acknowledges understanding d/c instructions, and endorses comprehension to checkout at registration before leaving.  °

## 2021-05-20 NOTE — ED Triage Notes (Signed)
Pt c/o body aches, cough and congestion onset Thursday.

## 2021-05-20 NOTE — Discharge Instructions (Signed)
Likely a viral infection, recommend over-the-counter pain medications like ibuprofen Tylenol for fever and pain control, nasal decongestions like Flonase and Zyrtec, Mucinex for cough.  If not eating recommend supplementing with Gatorade to help with electrolyte supplementation.  Follow-up PCP for further evaluation.  Come back to the emergency department if you develop chest pain, shortness of breath, severe abdominal pain, uncontrolled nausea, vomiting, diarrhea.  

## 2021-05-20 NOTE — ED Notes (Signed)
ED Provider at bedside. 

## 2021-05-20 NOTE — ED Notes (Addendum)
First contact with patient. Pt arrived via triage from home with c/o generalized body aches. Pt. denies shob, is A&OX 4, resp. even/unlabored. Pt placed into gown and  call light within reach. Patient updated on plan of care. Will continue to monitor patient.

## 2021-12-29 IMAGING — DX DG CHEST 1V PORT
1 series · 1 of 1 positions shown · non-contrast
Comparison: 04/20/2013

CLINICAL DATA: The cough and congestion.  Body aches.

EXAM:
PORTABLE CHEST 1 VIEW

[chest]
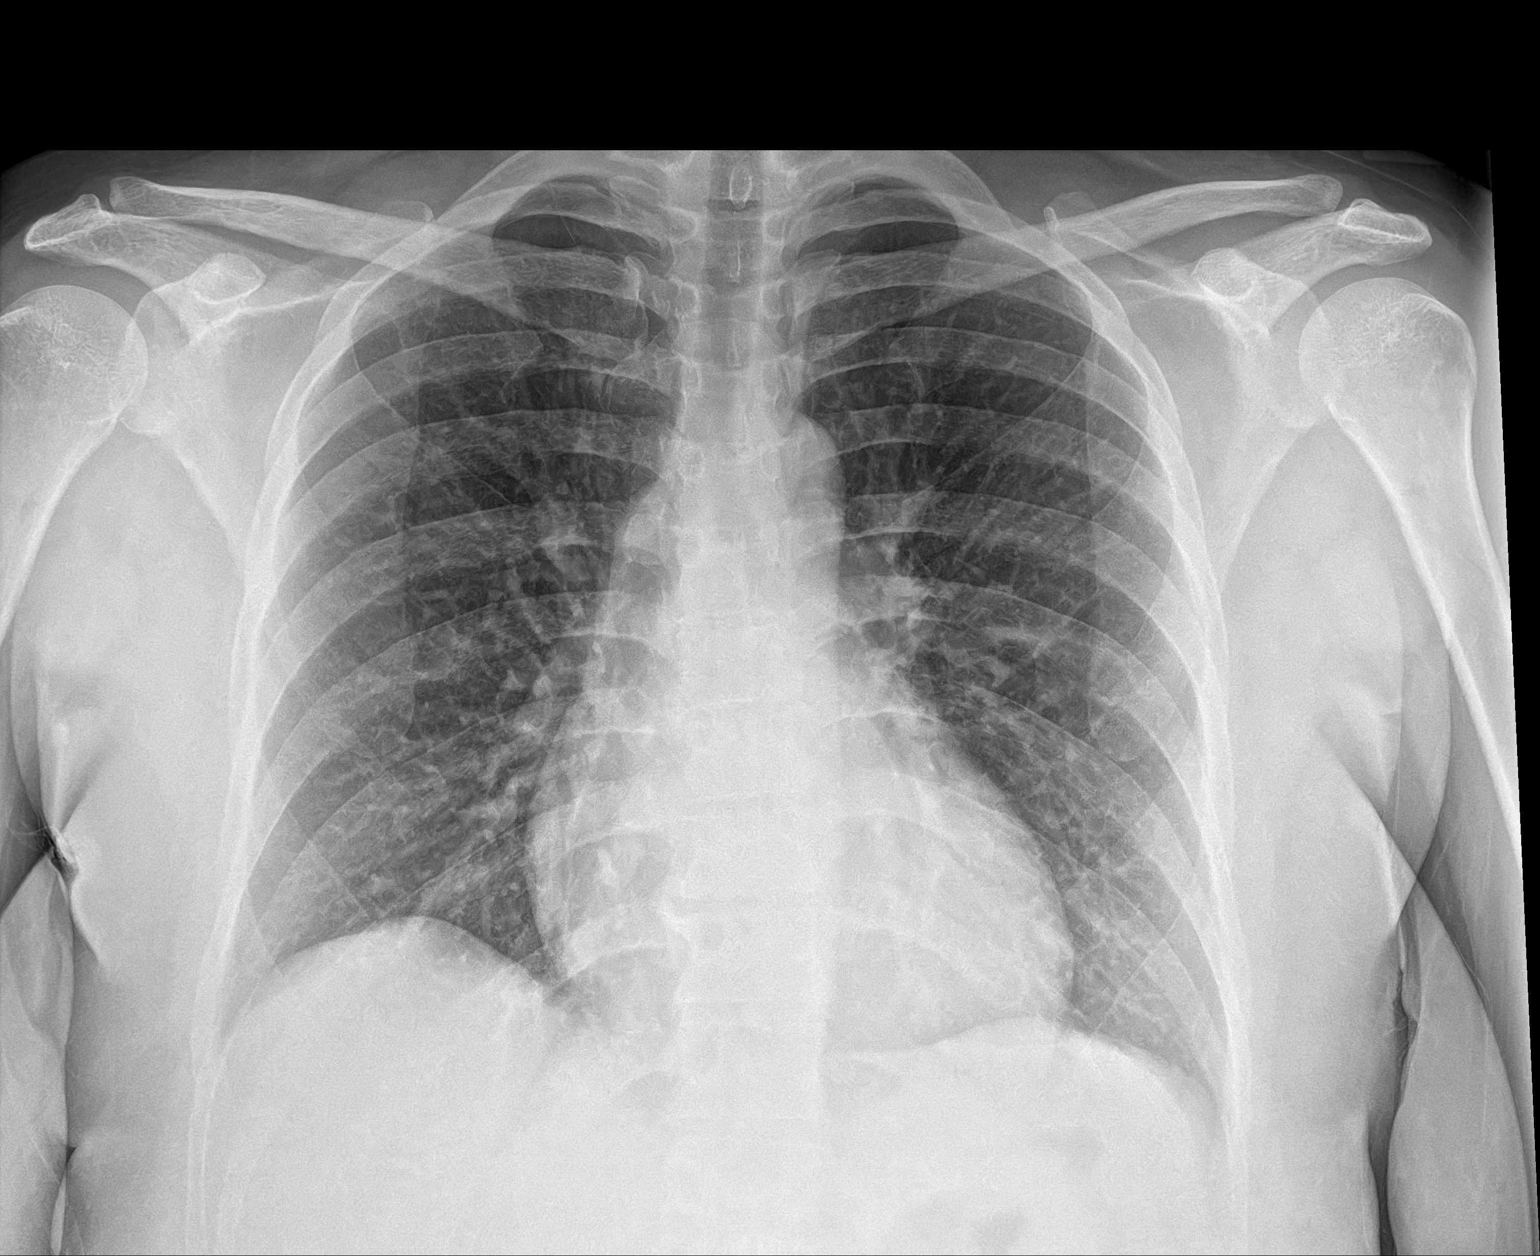

[1 of 1 positions shown; findings below may reference images not displayed]

FINDINGS: Normal heart, mediastinum and hila.

Clear lungs.  No pleural effusion or pneumothorax.

Skeletal structures are grossly intact.
IMPRESSION: No active disease.

## 2022-09-02 ENCOUNTER — Other Ambulatory Visit: Payer: Self-pay | Admitting: General Surgery

## 2022-09-02 DIAGNOSIS — N6324 Unspecified lump in the left breast, lower inner quadrant: Secondary | ICD-10-CM

## 2023-07-31 ENCOUNTER — Other Ambulatory Visit: Payer: Self-pay | Admitting: Orthopaedic Surgery

## 2023-07-31 DIAGNOSIS — M25572 Pain in left ankle and joints of left foot: Secondary | ICD-10-CM

## 2023-08-14 ENCOUNTER — Other Ambulatory Visit: Payer: Medicaid Other

## 2023-08-21 ENCOUNTER — Ambulatory Visit
Admission: RE | Admit: 2023-08-21 | Discharge: 2023-08-21 | Disposition: A | Discharge status: Home / Self Care | Payer: 59 | Source: Ambulatory Visit | Attending: Orthopaedic Surgery

## 2023-08-21 DIAGNOSIS — M25572 Pain in left ankle and joints of left foot: Secondary | ICD-10-CM
